# Patient Record
Sex: Female | Born: 1985 | Race: Black or African American | Hispanic: No | Marital: Single | Smoking: Never smoker
Health system: Southern US, Community
[De-identification: ages and names within clinical notes are randomized; demographics above are authoritative.]

## PROBLEM LIST (undated history)

## (undated) ENCOUNTER — Inpatient Hospital Stay (HOSPITAL_COMMUNITY): Payer: Self-pay

## (undated) DIAGNOSIS — A5400 Gonococcal infection of lower genitourinary tract, unspecified: Secondary | ICD-10-CM

## (undated) DIAGNOSIS — N200 Calculus of kidney: Secondary | ICD-10-CM

## (undated) DIAGNOSIS — Z8619 Personal history of other infectious and parasitic diseases: Secondary | ICD-10-CM

## (undated) DIAGNOSIS — R5381 Other malaise: Secondary | ICD-10-CM

## (undated) DIAGNOSIS — F329 Major depressive disorder, single episode, unspecified: Secondary | ICD-10-CM

## (undated) DIAGNOSIS — H1130 Conjunctival hemorrhage, unspecified eye: Secondary | ICD-10-CM

## (undated) DIAGNOSIS — F32A Depression, unspecified: Secondary | ICD-10-CM

## (undated) DIAGNOSIS — E559 Vitamin D deficiency, unspecified: Secondary | ICD-10-CM

## (undated) DIAGNOSIS — D649 Anemia, unspecified: Secondary | ICD-10-CM

## (undated) DIAGNOSIS — Z5189 Encounter for other specified aftercare: Secondary | ICD-10-CM

## (undated) HISTORY — PX: TUBAL LIGATION: SHX77

## (undated) HISTORY — DX: Personal history of other infectious and parasitic diseases: Z86.19

## (undated) HISTORY — DX: Major depressive disorder, single episode, unspecified: F32.9

## (undated) HISTORY — DX: Depression, unspecified: F32.A

## (undated) HISTORY — PX: HERNIA REPAIR: SHX51

## (undated) HISTORY — DX: Anemia, unspecified: D64.9

## (undated) HISTORY — DX: Encounter for other specified aftercare: Z51.89

---
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
## 2006-06-28 ENCOUNTER — Ambulatory Visit: Payer: Self-pay | Admitting: Family Medicine

## 2006-06-30 ENCOUNTER — Encounter (INDEPENDENT_AMBULATORY_CARE_PROVIDER_SITE_OTHER): Payer: Self-pay | Admitting: Family Medicine

## 2006-07-15 ENCOUNTER — Ambulatory Visit (HOSPITAL_COMMUNITY): Admission: RE | Admit: 2006-07-15 | Discharge: 2006-07-15 | Payer: Self-pay | Admitting: Obstetrics

## 2006-08-08 ENCOUNTER — Emergency Department (HOSPITAL_COMMUNITY): Admission: EM | Admit: 2006-08-08 | Discharge: 2006-08-08 | Payer: Self-pay | Admitting: Emergency Medicine

## 2006-09-02 ENCOUNTER — Inpatient Hospital Stay (HOSPITAL_COMMUNITY): Admission: AD | Admit: 2006-09-02 | Discharge: 2006-09-02 | Payer: Self-pay | Admitting: Obstetrics & Gynecology

## 2007-02-20 ENCOUNTER — Ambulatory Visit: Payer: Self-pay | Admitting: Family Medicine

## 2007-02-22 ENCOUNTER — Ambulatory Visit: Payer: Self-pay | Admitting: Family Medicine

## 2007-03-22 ENCOUNTER — Telehealth (INDEPENDENT_AMBULATORY_CARE_PROVIDER_SITE_OTHER): Payer: Self-pay | Admitting: *Deleted

## 2007-04-05 ENCOUNTER — Ambulatory Visit: Payer: Self-pay | Admitting: Nurse Practitioner

## 2007-04-05 LAB — CONVERTED CEMR LAB: Preg, Serum: NEGATIVE

## 2007-04-07 ENCOUNTER — Ambulatory Visit: Payer: Self-pay | Admitting: Nurse Practitioner

## 2007-06-29 ENCOUNTER — Encounter (INDEPENDENT_AMBULATORY_CARE_PROVIDER_SITE_OTHER): Payer: Self-pay | Admitting: Nurse Practitioner

## 2007-06-29 ENCOUNTER — Ambulatory Visit: Payer: Self-pay | Admitting: Family Medicine

## 2007-07-11 ENCOUNTER — Encounter (INDEPENDENT_AMBULATORY_CARE_PROVIDER_SITE_OTHER): Payer: Self-pay | Admitting: Family Medicine

## 2007-07-17 ENCOUNTER — Encounter: Payer: Self-pay | Admitting: Family Medicine

## 2007-09-20 ENCOUNTER — Ambulatory Visit: Payer: Self-pay | Admitting: Internal Medicine

## 2007-09-20 LAB — CONVERTED CEMR LAB
Bilirubin Urine: NEGATIVE
Urobilinogen, UA: 0.2

## 2007-10-04 ENCOUNTER — Telehealth (INDEPENDENT_AMBULATORY_CARE_PROVIDER_SITE_OTHER): Payer: Self-pay | Admitting: *Deleted

## 2007-10-10 ENCOUNTER — Emergency Department (HOSPITAL_COMMUNITY): Admission: EM | Admit: 2007-10-10 | Discharge: 2007-10-10 | Payer: Self-pay | Admitting: Emergency Medicine

## 2007-11-13 ENCOUNTER — Ambulatory Visit: Payer: Self-pay | Admitting: Family Medicine

## 2007-11-13 ENCOUNTER — Encounter (INDEPENDENT_AMBULATORY_CARE_PROVIDER_SITE_OTHER): Payer: Self-pay | Admitting: Family Medicine

## 2007-11-15 ENCOUNTER — Ambulatory Visit: Payer: Self-pay | Admitting: Family Medicine

## 2007-11-15 DIAGNOSIS — A5400 Gonococcal infection of lower genitourinary tract, unspecified: Secondary | ICD-10-CM

## 2007-11-15 HISTORY — DX: Gonococcal infection of lower genitourinary tract, unspecified: A54.00

## 2007-11-15 LAB — CONVERTED CEMR LAB: Chlamydia, DNA Probe: NEGATIVE

## 2007-12-12 ENCOUNTER — Ambulatory Visit: Payer: Self-pay | Admitting: Family Medicine

## 2008-01-26 ENCOUNTER — Telehealth (INDEPENDENT_AMBULATORY_CARE_PROVIDER_SITE_OTHER): Payer: Self-pay | Admitting: *Deleted

## 2008-01-29 ENCOUNTER — Ambulatory Visit: Payer: Self-pay | Admitting: Family Medicine

## 2008-01-31 ENCOUNTER — Ambulatory Visit: Payer: Self-pay | Admitting: Family Medicine

## 2008-02-13 ENCOUNTER — Ambulatory Visit: Payer: Self-pay | Admitting: Family Medicine

## 2008-02-15 ENCOUNTER — Encounter (INDEPENDENT_AMBULATORY_CARE_PROVIDER_SITE_OTHER): Payer: Self-pay | Admitting: *Deleted

## 2008-02-15 LAB — CONVERTED CEMR LAB: Chlamydia, DNA Probe: NEGATIVE

## 2008-02-16 ENCOUNTER — Telehealth (INDEPENDENT_AMBULATORY_CARE_PROVIDER_SITE_OTHER): Payer: Self-pay | Admitting: *Deleted

## 2008-03-04 ENCOUNTER — Ambulatory Visit: Payer: Self-pay | Admitting: Family Medicine

## 2008-04-04 ENCOUNTER — Telehealth (INDEPENDENT_AMBULATORY_CARE_PROVIDER_SITE_OTHER): Payer: Self-pay | Admitting: Family Medicine

## 2008-04-11 ENCOUNTER — Ambulatory Visit: Payer: Self-pay | Admitting: Nurse Practitioner

## 2008-04-11 DIAGNOSIS — H1130 Conjunctival hemorrhage, unspecified eye: Secondary | ICD-10-CM

## 2008-04-11 HISTORY — DX: Conjunctival hemorrhage, unspecified eye: H11.30

## 2008-05-27 ENCOUNTER — Ambulatory Visit: Payer: Self-pay | Admitting: Family Medicine

## 2008-08-28 ENCOUNTER — Encounter (INDEPENDENT_AMBULATORY_CARE_PROVIDER_SITE_OTHER): Payer: Self-pay | Admitting: Family Medicine

## 2008-08-28 ENCOUNTER — Ambulatory Visit: Payer: Self-pay | Admitting: Internal Medicine

## 2008-10-04 ENCOUNTER — Telehealth (INDEPENDENT_AMBULATORY_CARE_PROVIDER_SITE_OTHER): Payer: Self-pay | Admitting: Family Medicine

## 2008-10-11 ENCOUNTER — Encounter (INDEPENDENT_AMBULATORY_CARE_PROVIDER_SITE_OTHER): Payer: Self-pay | Admitting: *Deleted

## 2009-01-01 ENCOUNTER — Emergency Department (HOSPITAL_COMMUNITY): Admission: EM | Admit: 2009-01-01 | Discharge: 2009-01-01 | Payer: Self-pay | Admitting: Emergency Medicine

## 2009-01-10 ENCOUNTER — Telehealth (INDEPENDENT_AMBULATORY_CARE_PROVIDER_SITE_OTHER): Payer: Self-pay | Admitting: Family Medicine

## 2009-01-27 ENCOUNTER — Other Ambulatory Visit: Admission: RE | Admit: 2009-01-27 | Discharge: 2009-01-27 | Payer: Self-pay | Admitting: Internal Medicine

## 2009-01-27 ENCOUNTER — Encounter (INDEPENDENT_AMBULATORY_CARE_PROVIDER_SITE_OTHER): Payer: Self-pay | Admitting: Nurse Practitioner

## 2009-01-27 ENCOUNTER — Ambulatory Visit: Payer: Self-pay | Admitting: Nurse Practitioner

## 2009-01-27 LAB — CONVERTED CEMR LAB
ALT: 20 units/L (ref 0–35)
Albumin: 4.2 g/dL (ref 3.5–5.2)
BUN: 10 mg/dL (ref 6–23)
CO2: 23 meq/L (ref 19–32)
Calcium: 9.8 mg/dL (ref 8.4–10.5)
Chloride: 109 meq/L (ref 96–112)
Creatinine, Ser: 0.75 mg/dL (ref 0.40–1.20)
Eosinophils Absolute: 0.1 10*3/uL (ref 0.0–0.7)
Eosinophils Relative: 4 % (ref 0–5)
Glucose, Urine, Semiquant: NEGATIVE
HCT: 36.4 % (ref 36.0–46.0)
Hemoglobin: 11.9 g/dL — ABNORMAL LOW (ref 12.0–15.0)
Lymphs Abs: 1.8 10*3/uL (ref 0.7–4.0)
MCV: 97.1 fL (ref 78.0–100.0)
Monocytes Relative: 5 % (ref 3–12)
Nitrite: NEGATIVE
Potassium: 5.6 meq/L — ABNORMAL HIGH (ref 3.5–5.3)
Protein, U semiquant: 30
RBC: 3.75 M/uL — ABNORMAL LOW (ref 3.87–5.11)
TSH: 0.932 microintl units/mL (ref 0.350–4.500)
Urobilinogen, UA: 0.2
WBC Urine, dipstick: NEGATIVE
WBC: 4.1 10*3/uL (ref 4.0–10.5)
pH: 6.5

## 2009-01-30 ENCOUNTER — Ambulatory Visit: Payer: Self-pay | Admitting: Nurse Practitioner

## 2009-07-30 ENCOUNTER — Ambulatory Visit: Payer: Self-pay | Admitting: Nurse Practitioner

## 2009-07-30 DIAGNOSIS — R5383 Other fatigue: Secondary | ICD-10-CM

## 2009-07-30 DIAGNOSIS — N898 Other specified noninflammatory disorders of vagina: Secondary | ICD-10-CM

## 2009-07-30 DIAGNOSIS — R5381 Other malaise: Secondary | ICD-10-CM

## 2009-07-30 HISTORY — DX: Other malaise: R53.81

## 2009-07-30 LAB — CONVERTED CEMR LAB
Basophils Relative: 0 % (ref 0–1)
Eosinophils Absolute: 0.1 10*3/uL (ref 0.0–0.7)
MCHC: 32.6 g/dL (ref 30.0–36.0)
Monocytes Relative: 6 % (ref 3–12)
Neutro Abs: 2.1 10*3/uL (ref 1.7–7.7)
Neutrophils Relative %: 43 % (ref 43–77)
Platelets: 326 10*3/uL (ref 150–400)
RBC: 3.94 M/uL (ref 3.87–5.11)
WBC: 4.8 10*3/uL (ref 4.0–10.5)
hCG, Beta Chain, Quant, S: 439.2 milliintl units/mL

## 2009-08-07 ENCOUNTER — Ambulatory Visit: Payer: Self-pay | Admitting: Nurse Practitioner

## 2009-08-18 ENCOUNTER — Telehealth (INDEPENDENT_AMBULATORY_CARE_PROVIDER_SITE_OTHER): Payer: Self-pay | Admitting: Nurse Practitioner

## 2010-01-01 ENCOUNTER — Ambulatory Visit: Payer: Self-pay | Admitting: Nurse Practitioner

## 2010-01-01 DIAGNOSIS — B379 Candidiasis, unspecified: Secondary | ICD-10-CM | POA: Insufficient documentation

## 2010-01-01 LAB — CONVERTED CEMR LAB
Ketones, urine, test strip: NEGATIVE
Nitrite: NEGATIVE
Specific Gravity, Urine: >=1.03
Urobilinogen, UA: 0.2
WBC Urine, dipstick: NEGATIVE

## 2010-01-14 ENCOUNTER — Emergency Department (HOSPITAL_COMMUNITY): Admission: EM | Admit: 2010-01-14 | Discharge: 2010-01-14 | Payer: Self-pay | Admitting: Family Medicine

## 2010-05-11 ENCOUNTER — Telehealth (INDEPENDENT_AMBULATORY_CARE_PROVIDER_SITE_OTHER): Payer: Self-pay | Admitting: Nurse Practitioner

## 2010-06-24 ENCOUNTER — Telehealth (INDEPENDENT_AMBULATORY_CARE_PROVIDER_SITE_OTHER): Payer: Self-pay | Admitting: Nurse Practitioner

## 2010-07-02 ENCOUNTER — Ambulatory Visit
Admission: RE | Admit: 2010-07-02 | Discharge: 2010-07-02 | Payer: Self-pay | Source: Home / Self Care | Attending: Nurse Practitioner | Admitting: Nurse Practitioner

## 2010-07-02 DIAGNOSIS — R109 Unspecified abdominal pain: Secondary | ICD-10-CM | POA: Insufficient documentation

## 2010-07-02 LAB — CONVERTED CEMR LAB
Bilirubin Urine: NEGATIVE
Ketones, urine, test strip: NEGATIVE
Protein, U semiquant: 30
Urobilinogen, UA: 0.2

## 2010-07-07 NOTE — Progress Notes (Signed)
Summary: GYN Referral  Phone Note From Other Clinic   Caller: Referral Coordinator Summary of Call: Printed off referral today & call to sched Pt appt.Marland KitchenMarland KitchenPt aware of appt on 08-21-09 @ 2pm @ [facility].Let me know if I could be of further assistance Initial call taken by: Candi Leash,  August 18, 2009 4:06 PM  Follow-up for Phone Call        noted Follow-up by: Lehman Prom FNP,  August 18, 2009 4:07 PM

## 2010-07-07 NOTE — Assessment & Plan Note (Signed)
Summary: Vaginal bleeding   Vital Signs:  Patient profile:   25 year old female Weight:      106.5 pounds BMI:     19.55 BSA:     1.46 Temp:     97.9 degrees F oral Pulse rate:   121 / minute Pulse rhythm:   regular Resp:     20 per minute BP sitting:   141 / 95  (left arm) Cuff size:   regular  Vitals Entered By: Levon Hedger (July 30, 2009 12:14 PM) CC: pt state she had an abortion 2 weeks ago and is still bleeding and is not able to cope with it....pt is very distressed Is Patient Diabetic? No Pain Assessment Patient in pain? no       Does patient need assistance? Functional Status Self care Ambulation Normal Comments pt is not taking ortho try cyclen   Primary Care Provider:  [facility]  CC:  pt state she had an abortion 2 weeks ago and is still bleeding and is not able to cope with it....pt is very distressed.  History of Present Illness:  Pt into the office for follow up for vaginal bleeding. Abortion done on February 11th, 2011 in [place]. Pt was given a 2 week follow up but reports she is not able to keep the appointment. Pt reports that she has trouble coping following the procedure. She already has 2 children and they went to stay with their father in the interim.  They have since returned home and she is getting back into her routine. She quit her job but has since found a new one. She is still having trouble sleeping, cries to sleep at night - ok during the day when she is otherwise occupied.  Bleeding on the day of the procedure and into the night, with cramping.  Stopped the next day but then stopped the following day and then started back 3 days after the procedure.  Still at this time with scant flow, no cramping but pt is just concerned about the length of flow.  Allergies (verified): 1)  ! Amoxicillin 2)  ! Ibuprofen  Review of Systems General:  Complains of sleep disorder. CV:  Denies chest pain or discomfort. Resp:  Denies  cough. GI:  Denies abdominal pain. GU:  Complains of abnormal vaginal bleeding.  Physical Exam  General:  alert.   Head:  normocephalic.   Abdomen:  normal bowel sounds.   Msk:  normal ROM.   Neurologic:  alert & oriented X3.     Impression & Recommendations:  Problem # 1:  VAGINAL BLEEDING (ICD-623.8) advised pt the usual course Orders: T-Pregnancy (Serum), Quant. [id number]) T-CBC w/Diff ([phone])  Problem # 2:  FATIGUE (ICD-780.79) will check cbc. advised pt to keep a diary of events Orders: T-CBC w/Diff ([id number])  Patient Instructions: 1)  Take tylenol pm over the counter at night for sleep. 2)  This is just temporary.  Get in a daily routine. 3)  Keep a notebook of your daily feelings and emtions.  Do this at night after you take your tylenol pm and record your thoughts. 4)  Follow up next week. Bring your journal with you

## 2010-07-07 NOTE — Assessment & Plan Note (Signed)
Summary: Vaginal Bleeding/Family Planning   Vital Signs:  Patient profile:   25 year old female Weight:      104.3 pounds Temp:     98.2 degrees F oral Pulse rate:   78 / minute Pulse rhythm:   regular Resp:     20 per minute BP sitting:   112 / 80  (left arm) Cuff size:   regular  Vitals Entered By: Levon Hedger (August 07, 2009 12:27 PM) CC: follow-up visit Is Patient Diabetic? No Pain Assessment Patient in pain? no       Does patient need assistance? Functional Status Self care Ambulation Normal   CC:  follow-up visit.  History of Present Illness:  Pt into the office for follow up  - 1 week visit for vaginal bleeding. Pt has been doing well over the past week. Much better spirits today. Bleeding has tapered to just spotting - labs ok on last week.  no anemia Beta Hcg trending as expected  Depressed mood on last week with regrets on her decision.  Pt was encouraged to keep a journal nightly and express her thoughts.  Although she did not bring the journal into the office she did write in it nightly.  She took tylenol PM to help her rest.  She found it helpful to write in the journal and had only 1 day that she did not work but stayed in the bed all day.  Family support is available to help with the children. She has started her new job and has a 20 minute commute from work daily.  Family planning - no current birthcontrol pt was previously on depoprovera but quit taking it because she thought it made her lose weight. She was last on oral contraceptives but she could not remember to take them daily hence her recent pregnancy. 3 children current stable relationship   Habits & Providers  Alcohol-Tobacco-Diet     Alcohol drinks/day: 0     Tobacco Status: never  Exercise-Depression-Behavior     Does Patient Exercise: no     Depression Counseling: not indicated; screening negative for depression     Drug Use: no     Seat Belt Use: 100     Sun Exposure:  occasionally  Allergies (verified): 1)  ! Amoxicillin 2)  ! Ibuprofen  Review of Systems General:  Denies fever. CV:  Denies chest pain or discomfort. Resp:  Denies cough. GI:  Denies abdominal pain. GU:  Complains of abnormal vaginal bleeding; vaginal bleeding has decreased to spotting.  Physical Exam  General:  alert.   Msk:  normal ROM.   Neurologic:  alert & oriented X3.   Skin:  color normal.   Psych:  Oriented X3 and good eye contact.     Impression & Recommendations:  Problem # 1:  VAGINAL BLEEDING (ICD-623.8) resolving  Problem # 2:  FAMILY PLANNING (ICD-V25.09)  pt has decided that she would like the IUD will refer to GYN for placement  Orders: Gynecologic Referral (Gyn)  Patient Instructions: 1)  Your labs were ok as done on the last visit. 2)  You should continue to do the journaling for the next 3 weeks and then start to make a check list of things you want to improve.

## 2010-07-07 NOTE — Assessment & Plan Note (Signed)
Summary: Acute - Candidiasis   Vital Signs:  Patient profile:   25 year old female Menstrual status:  mirena Weight:      102.7 pounds Temp:     98.3 degrees F oral Pulse rate:   78 / minute Pulse rhythm:   regular Resp:     20 per minute BP sitting:   111 / 74  (left arm) Cuff size:   regular  Vitals Entered By: Levon Hedger (January 01, 2010 3:45 PM) CC: vaginal discomfort has been swimming alot and felt a little irritation in that area and wants to be tested for yeast infection Is Patient Diabetic? No Pain Assessment Patient in pain? no       Does patient need assistance? Functional Status Self care Ambulation Normal LMP - Character: light     Menstrual Status mirena Last PAP Result  Specimen Adequacy: Satisfactory for evaluation.   Interpretation/Result:Negative for intraepithelial Lesion or Malignancy.      Primary Care Provider:  [facility]  CC:  vaginal discomfort has been swimming alot and felt a little irritation in that area and wants to be tested for yeast infection.  History of Present Illness:  Pt into the office with c/o possible yeast infection Pt reports that she has been going to the pool a lot She did purchase some OTC medications 1 weeks ago which did provide some relief. +itching +discharge (thick, white) no odor No dysuria or hematuria  Family Planning - IUD inserted since last visit here. Scant spotting and cramping first but now she is tolerating well.  Habits & Providers  Alcohol-Tobacco-Diet     Alcohol drinks/day: 0     Tobacco Status: never  Exercise-Depression-Behavior     Does Patient Exercise: no     Depression Counseling: not indicated; screening negative for depression     Drug Use: no     Seat Belt Use: 100     Sun Exposure: occasionally  Allergies (verified): 1)  ! Amoxicillin 2)  ! Ibuprofen  Review of Systems General:  Denies fever. CV:  Denies chest pain or discomfort. Resp:  Denies cough. GI:  Denies abdominal  pain, nausea, and vomiting. GU:  Complains of discharge; denies abnormal vaginal bleeding, dysuria, hematuria, and urinary frequency.  Physical Exam  General:  alert.  thin Head:  normocephalic.   Lungs:  normal breath sounds.   Heart:  normal rate and regular rhythm.   Abdomen:  normal bowel sounds.   Msk:  normal ROM.   Neurologic:  alert & oriented X3.   Skin:  color normal.   Psych:  Oriented X3.     Impression & Recommendations:  Problem # 1:  CANDIDIASIS (ICD-112.9) pt has take OTC meds and symptoms have resolved will order diflucan 150mg  by mouth x 1 dose if symptoms return.  Problem # 2:  INTRAUTERINE CONTRACEPTIVE DEVICE SURVEILLANCE (ICD-V25.42) pt is tolerating well  Complete Medication List: 1)  Fluconazole 150 Mg Tabs (Fluconazole) .... One tablet by mouth x 1 dose  Patient Instructions: 1)  Urine sample ok. 2)  Still see a few yeast buds under the microscope. 3)  If symptoms return may start diflucan 150mg  by mouth x 1 dose. 4)  Follow up as needed Prescriptions: FLUCONAZOLE 150 MG TABS (FLUCONAZOLE) One tablet by mouth x 1 dose  #1 x 0   Entered and Authorized by:   Lehman Prom FNP   Signed by:   Lehman Prom FNP on 01/01/2010   Method used:   Print then  Give to Patient   RxID:   [id number]   Laboratory Results   Urine Tests  Date/Time Received: January 01, 2010 3:59 PM   Routine Urinalysis   Color: lt. yellow Appearance: Clear Glucose: negative   (Normal Range: Negative) Bilirubin: negative   (Normal Range: Negative) Ketone: negative   (Normal Range: Negative) Spec. Gravity: >=1.030   (Normal Range: 1.003-1.035) Blood: negative   (Normal Range: Negative) pH: 5.0   (Normal Range: 5.0-8.0) Protein: trace   (Normal Range: Negative) Urobilinogen: 0.2   (Normal Range: 0-1) Nitrite: negative   (Normal Range: Negative) Leukocyte Esterace: negative   (Normal Range: Negative)    Date/Time Received: January 01, 2010 4:14 PM   Wet  Mount/KOH Source: vaginal WBC/hpf: 1-5 Bacteria/hpf: rare Clue cells/hpf: few Yeast/hpf: few Trichomonas/hpf: none    Laboratory Results   Urine Tests    Routine Urinalysis   Color: lt. yellow Appearance: Clear Glucose: negative   (Normal Range: Negative) Bilirubin: negative   (Normal Range: Negative) Ketone: negative   (Normal Range: Negative) Spec. Gravity: >=1.030   (Normal Range: 1.003-1.035) Blood: negative   (Normal Range: Negative) pH: 5.0   (Normal Range: 5.0-8.0) Protein: trace   (Normal Range: Negative) Urobilinogen: 0.2   (Normal Range: 0-1) Nitrite: negative   (Normal Range: Negative) Leukocyte Esterace: negative   (Normal Range: Negative)      Wet Mount Wet Mount KOH: Negative

## 2010-07-07 NOTE — Progress Notes (Signed)
Summary: PELVIC PAIN  Phone Note Call from Patient Call back at Home Phone [phone]   Reason for Call: Talk to Nurse Summary of Call: MARTIN PT. MS Blyden SAYS SHE HAS BEEN HAVING PAIN IN HER PELVIC AREA FOR A WEEK AND DISCHARGE. SHE HAD THE BIRANA TAKEN OUT 2 MONTHS AGO AND SHE THINKS IT MAY HAVE SOMETHING TO DO WITH THAT. Initial call taken by: Leodis Rains,  May 11, 2010 9:11 AM  Follow-up for Phone Call        left message to return call....Marland KitchenMarland KitchenHassell Halim CMA  May 11, 2010 12:22 PM   Additional Follow-up for Phone Call Additional follow up Details #1::        left message to return call.Hassell Halim CMA  May 12, 2010 2:41 PM     Additional Follow-up for Phone Call Additional follow up Details #2::    patient returned your call to let you know that her gyn office [facility] saw her yesterday and everything is ok, and she doesn't need an appt. now. Follow-up by: Leodis Rains,  May 12, 2010 4:58 PM

## 2010-07-09 NOTE — Assessment & Plan Note (Signed)
Summary: Abdominal cramping   Vital Signs:  Patient profile:   25 year old female Menstrual status:  mirena Weight:      106.0 pounds BMI:     19.46 Temp:     99.0 degrees F oral Pulse rate:   80 / minute Pulse rhythm:   regular Resp:     20 per minute BP sitting:   102 / 76  (left arm) Cuff size:   regular  Vitals Entered By: Levon Hedger (July 02, 2010 12:36 PM) CC: has not had a cycle in 4 months and has been having stomach cramps x 3 weeks..vaginal discharge Is Patient Diabetic? No Pain Assessment Patient in pain? no       Does patient need assistance? Functional Status Self care Ambulation Normal Comments pt states she takes iron multivitamin daily.   Primary Care Provider:  [facility]  CC:  has not had a cycle in 4 months and has been having stomach cramps x 3 weeks..vaginal discharge.  History of Present Illness:  Pt into the office with c/o abdominal pain. Mirena placed about 1 year ago but pt did not like it because she could "feel" it during straining activity at work.  Removed in 02/2010 Depoprovera was started in the meantime until she should get the implanon ordered. implanon placed in left arm 05/2010 No menses with any of the above methods  Lower abdominal cramping started 2 weeks ago. Slight discharge No odor  No blood in urine. Admits to intercourse 2 weeks ago.  Condom was left inside for 4 days before she realized it.   Allergies (verified): 1)  ! Amoxicillin 2)  ! Ibuprofen  Review of Systems General:  Denies fever. CV:  Denies fatigue. Resp:  Denies cough. GI:  Denies abdominal pain, nausea, and vomiting.  Physical Exam  General:  alert.   Head:  normocephalic.   Lungs:  normal breath sounds.   Heart:  normal rate and regular rhythm.   Msk:  up to the exam table Neurologic:  alert & oriented X3.   Skin:  color normal.   Psych:  Oriented X3.     Impression & Recommendations:  Problem # 1:  ABDOMINAL CRAMPS  (ICD-789.00)  advised pt that cramping is due to change in hormones with changes in contraception  Orders: KOH/ WET Mount [phone]) UA Dipstick w/o Micro (manual) (30865) Urine Pregnancy Test  (78469)  Problem # 2:  INSERTION OF IMPLANTABLE SUBDERMAL CONTRACEPTIVE (ICD-V25.5)  Complete Medication List: 1)  Metrogel-vaginal 0.75 % Gel (Metronidazole) .... One application intravaginally at night  Patient Instructions: 1)  Abdominal cramping is most likely from the change in hormones your body has gone through over the past 3 months.  You can take aleve or advil for cramping. 2)  Pregnancy test is negative. Checked just to reassure you. 3)  Cramping/discharge will likely resolve on its own.  Again this is most likely due to hormones. But will order metrogel to use just in case symptoms get worse Prescriptions: METROGEL-VAGINAL 0.75 % GEL (METRONIDAZOLE) One application intravaginally at night  #45gm x 0   Entered and Authorized by:   Lehman Prom FNP   Signed by:   Lehman Prom FNP on 07/02/2010   Method used:   Print then Give to Patient   RxID:   [id number]    Orders Added: 1)  Est. Patient Level III [72536] 2)  KOH/ WET Mount [64403] 3)  UA Dipstick w/o Micro (manual) [81002] 4)  Urine Pregnancy Test  [  81025]    Prevention & Chronic Care Immunizations   Influenza vaccine: Not documented   Influenza vaccine deferral: Refused  (07/02/2010)    Tetanus booster: 01/27/2009: Tdap    Pneumococcal vaccine: Not documented  Other Screening   Pap smear:  Specimen Adequacy: Satisfactory for evaluation.   Interpretation/Result:Negative for intraepithelial Lesion or Malignancy.     (01/27/2009)   Smoking status: never  (01/01/2010)   Laboratory Results   Urine Tests  Date/Time Received: July 02, 2010 1:30 PM   Routine Urinalysis   Color: lt. yellow Glucose: negative   (Normal Range: Negative) Bilirubin: negative   (Normal Range: Negative) Ketone:  negative   (Normal Range: Negative) Spec. Gravity: >=1.030   (Normal Range: 1.003-1.035) Blood: negative   (Normal Range: Negative) pH: 6.0   (Normal Range: 5.0-8.0) Protein: 30   (Normal Range: Negative) Urobilinogen: 0.2   (Normal Range: 0-1) Nitrite: negative   (Normal Range: Negative) Leukocyte Esterace: negative   (Normal Range: Negative)    Urine HCG: positive Date/Time Received: July 02, 2010 1:27 PM   [employer] Source: vaginal WBC/hpf: 1-5 Bacteria/hpf: 1+ Clue cells/hpf: few Yeast/hpf: none Wet Mount KOH: Negative Trichomonas/hpf: none

## 2010-07-09 NOTE — Progress Notes (Addendum)
Summary: abdominal cramping  Phone Note Call from Patient   Complaint: Urinary/GYN Problems Summary of Call: pt is having cramps in stomach and has not had a cycle in for months//not sure if it is her birth control [phone] can't get appt till march to long to wait Initial call taken by: Arta Bruce,  June 24, 2010 8:18 AM  Follow-up for Phone Call        Left message on voicemail for pt. to return call.  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 Additional Follow-up by: Leodis Rains,  June 25, 2010 8:54 AM    Additional Follow-up for Phone Call Additional follow up Details #2::    what birth control is she talking about?  Pills - last rx 07/2009 pt can keep appt with provider in March if she wants to restart pills then she can come from a nurse visit for pregnancy test if negative provider can start pills.  she still needs to schedule another visit for a cpe  n.martin,fnp June 25, 2010  1:37 PM  No appointment noted in March.  Left message on voicemail for pt. to return call.  Dutch Quint RN  June 26, 2010 12:27 PM    Appended Document: abdominal cramping Has been using implannon, ordered by a different provider, has been using for a month -- had a condom left inside of her two weeks ago, came out 4 days later.  Has been having intermittent lower abdominal cramping ever since. Cramping is getting worse.  Denies fever, has white, thin discharge, denies odor.  Denies urinary or any other symptoms.    Has only taken Tylenol for discomfort.  Wants to be seen, appt. 07/02/10.  Dutch Quint RN  June 29, 2010 9:30 AM

## 2010-08-21 LAB — POCT URINALYSIS DIPSTICK
Bilirubin Urine: NEGATIVE
Glucose, UA: NEGATIVE mg/dL
Nitrite: NEGATIVE
Urobilinogen, UA: 1 mg/dL (ref 0.0–1.0)
pH: 6 (ref 5.0–8.0)

## 2010-08-21 LAB — WET PREP, GENITAL
Trich, Wet Prep: NONE SEEN
Yeast Wet Prep HPF POC: NONE SEEN

## 2010-08-21 LAB — POCT PREGNANCY, URINE: Preg Test, Ur: NEGATIVE

## 2010-08-21 LAB — URINE CULTURE: Culture  Setup Time: 201108102217

## 2010-08-21 LAB — GC/CHLAMYDIA PROBE AMP, GENITAL: Chlamydia, DNA Probe: NEGATIVE

## 2010-09-13 LAB — POCT URINALYSIS DIP (DEVICE)
Hgb urine dipstick: NEGATIVE
Ketones, ur: NEGATIVE mg/dL
Protein, ur: NEGATIVE mg/dL
Specific Gravity, Urine: >=1.03 (ref 1.005–1.030)
Urobilinogen, UA: 1 mg/dL (ref 0.0–1.0)
pH: 5.5 (ref 5.0–8.0)

## 2010-09-13 LAB — GC/CHLAMYDIA PROBE AMP, GENITAL
Chlamydia, DNA Probe: NEGATIVE
GC Probe Amp, Genital: NEGATIVE

## 2010-09-13 LAB — WET PREP, GENITAL: WBC, Wet Prep HPF POC: NONE SEEN

## 2010-09-13 LAB — POCT PREGNANCY, URINE: Preg Test, Ur: NEGATIVE

## 2010-10-23 NOTE — H&P (Signed)
NAMEIMUNIQUE, SAMAD                      ACCOUNT NO.:  [id number]   MEDICAL RECORD NO.:  [id number]                   PATIENT TYPE:  INP   LOCATION:  9165                                 FACILITY:  [facility]   PHYSICIAN:  Naima A. Dillard, M.D.              DATE OF BIRTH:  1986/05/11   DATE OF ADMISSION:  07/18/2002  DATE OF DISCHARGE:                                HISTORY & PHYSICAL   HISTORY OF PRESENT ILLNESS:  Ms. Mitschke is a 25 year old single black  female, primigravida at 61 and 5/7ths weeks who presents with regular  uterine contractions all day that have become more intense.  Her cervical  exam at the office earlier today was 1 cm.  She denies leaking, bleeding,  headache, nausea, vomiting, or fetal disturbances.  Her pregnancy has been  followed by [facility] certified nurse midwife service, and has  been remarkable for 1) decreased social support, 2) adolescent, 3)  questionable last menstrual period, 4) small frame, 5) group B strep  negative.   PRENATAL LABORATORIES:  Her prenatal labs were collected on 12/26/01.  Hemoglobin was 11.5, platelets 278,000, blood type O+, antibody negative,  sickle cell trait negative, RPR nonreactive, rubella immune, hepatitis B  surface antigen negative, HIV nonreactive.  Pap smear from 3/03 was normal.  Gonorrhea negative, Chlamydia negative.  On 01/23/02, her paternal serum  alpha fetoprotein was within normal range.  On 04/17/02, her one hour  Glucola was 80, and her hemoglobin at that time was 11.3. Culture of the  vaginal tract for group B strep, gonorrhea, and Chlamydia on 06/18/02 were  all negative.   HISTORY OF PRESENT PREGNANCY:  The patient presented for care on 12/26/01 at  approximately 10-1/[redacted] weeks gestation.  First trimester pregnancy  ultrasonography gave an EDC of 07/21/02.  From that initial ultrasound, there  was a possible cord insertion anomaly, as well as gastroschisis versus  _______.  Level II  ultrasonography at [facility] showed no  abnormalities noted.  At 16-1/[redacted] weeks gestation, at her routine visit the  patient was having difficulty with the father of the baby.  She was upset  and crying at that time, and stated that she was [facility]  and would get counseling through that association.  Pregnancy  ultrasonography at about [redacted] weeks gestation confirmed EDC.  At [redacted] weeks  gestation, she was noted to have uterine contractions and was treated in  maternity admissions with terbutaline, and had a negative fetal fibronectin  at that time.  Pregnancy ultrasonography at [redacted] weeks gestation was performed  secondary to size less than dates.  Estimated fetal weight at that time was  in the 75th to 90th percentile with normal fluid.  The rest of the prenatal  care was unremarkable.   OBSTETRICAL HISTORY:  She is a primigravida.   MEDICAL HISTORY:  She has no medication allergies.  She reports  having had  the usual childhood illnesses.  She has used oral contraceptives in the past  which she stopped in 4/03.  She was treated for Chlamydia in 1/03, and  treated for Trichomonas in 4/03.  She had a urinary tract infection x1.  In  the past, she fractured per pinky finger on her right hand.   SURGICAL HISTORY:  Remarkable for herniorrhaphy 10/18/01.   GENETIC HISTORY:  Unremarkable.   SOCIAL HISTORY:  Father of the baby is not involved with the pregnancy.  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  Father of the baby is high school  educated and in the [employer].  The patient denies any alcohol, tobacco,  or illicit drug use with the pregnancy.   OBJECTIVE DATA:  VITAL SIGNS:  Stable.  She is afebrile.  HEENT:  Grossly within normal limits.  CHEST:  Clear to auscultation.  HEART:  Regular rate and rhythm.  ABDOMEN:  Gravid and contoured with frontal head extending approximately 39  cm above the pubic symphysis.  Electronic fetal monitoring shows  reactive  reassuring fetal heart rate.  Uterine contractions every 3-5 minutes lasting  80 seconds of moderate intensity.  CERVICAL EXAM:  Per R.N., 3.5 cm, 80%, vertex, -2.  EXTREMITIES:  Within normal limits.   ASSESSMENT:  1. Intrauterine pregnancy at term.  2. Early labor.   PLAN:  1. Admit to birthing suites for a consult with Dr. Normand Sloop.  2. Routine CNM orders.  3. Plan IV pain medication.     Cam Hai, C.N.M.                     Naima A. Normand Sloop, M.D.    KS/MEDQ  D:  07/19/2002  T:  07/19/2002  Job:  [id number]

## 2010-10-23 NOTE — Discharge Summary (Signed)
   [name], [name]                      ACCOUNT NO.:  [id number]   MEDICAL RECORD NO.:  [id number]                   PATIENT TYPE:  INP   LOCATION:  9104                                 FACILITY:  [facility]   PHYSICIAN:  Crist Fat. Rivard, M.D.              DATE OF BIRTH:  [birth year]   DATE OF ADMISSION:  07/18/2002  DATE OF DISCHARGE:  07/21/2002                                 DISCHARGE SUMMARY   ADMISSION DIAGNOSES:  1. Intrauterine pregnancy at term.  2. Early labor.   DISCHARGE DIAGNOSES:  1. Intrauterine pregnancy at term.  2. Arrest of second stage of labor.  3. Meconium stained amniotic fluid.  4. Chorioamnionitis.   PROCEDURE:  1. Vacuum extraction, vaginal delivery.  2. Repair of bilateral labial lacerations.   HOSPITAL COURSE:  The patient is a 25 year old single black female  primigravida at 39-5/7 weeks who presented in early labor.  She progressed  to 5 cm and then her membranes were artificially ruptured.  She received an  epidural for pain.  Her progress was protracted and therefore received  internal monitoring as well as Pitocin augmentation.  She got to complete  with ROP position and pushed for 50 minutes, reaching +1 to +2 station.  Dr.  Stefano Gaul was consulted regarding the possibility of operative delivery.  The  patient was delivered via vacuum extraction.  The infant was a viable female,  weight 7 pounds 4 ounces, Apgars 9 at one minute and 9 at five minutes.  There was no meconium stain below the cord.  He was taken to the nursery in  good condition.  Mother recovered well after the procedure.  By postpartum  day #1, her hemoglobin was 9.3.  It had been 11.4 upon admission.  She was  afebrile.  By postpartum day #2, she continued to do well.  She expressed a  desire for Depo-Provera for contraception.  She was bottle feeding the  infant.  Social work was consulted regarding her housing and relational  issues.  The patient was deemed to have received  full benefit of her  hospital stay, and she was discharged home.   DISCHARGE MEDICATIONS:  1. Motrin 600 mg one p.o. q.6h. p.r.n. pain.  2. Tylox one to two p.o. q.3-4h. p.r.n. pain.  3. Depo-Provera 150 mg IM x1 prior to discharge.   DISCHARGE INSTRUCTIONS:  [facility] handout.  Discharge follow up  to occur at [facility] in four to six week postpartum.     Cam Hai, C.N.M.                     Crist Fat Rivard, M.D.    KS/MEDQ  D:  07/21/2002  T:  07/21/2002  Job:  [id number]

## 2010-10-23 NOTE — Op Note (Signed)
[name]  Patient:    [name], [name] Visit Number: [id number] MRN: [id number]          Service Type: DSU Location: [facility] Attending Physician:  Carlos Levering Dictated by:   Hyman Bible Pendse, M.D. Proc. Date: 10/18/01 Admit Date:  10/18/2001   CC:         Merita Norton, M.D.   Operative Report  PREOPERATIVE DIAGNOSIS:  Incarcerated  supraumbilical ventral hernia.  POSTOPERATIVE DIAGNOSIS:  Incarcerated  supraumbilical ventral hernia.  OPERATION PERFORMED:  Repair of incarcerated supraumbilical ventral hernia.  SURGEON:  Prabhakar D. Levie Heritage, M.D.  ASSISTANT:  Nurse.  ANESTHESIA:  Nurse.  DESCRIPTION OF PROCEDURE:  Under satisfactory general anesthesia, patient in supine position, the abdomen was thoroughly prepped and draped in the usual manner.  About 2.5 cm long transverse incision was made directly over the palpable incarcerated ventral hernia lesion.  Skin and subcutaneous tissue incised.  Bleeders were individually clamped, cut and electrocoagulated.  By blunt and sharp dissection incarcerated supraumbilical ventral hernia was identified.  There was preperitoneal fatty tissue with thin sac.  This was delineated up to the fascial defect, suture ligated with 2-0 silk and the fatty tissue was excised.  The remnant of the sac and tissue were returned to the peritoneal cavity.  The fascial defect was repaired in 3-0 Vicryl interrupted sutures. Satisfactory repair was accomplished.  0.25% Marcaine with epinephrine was injected locally for postoperative analgesia. Subcutaneous tissue apposed with 4-0 Vicryl, skin closed with 4-0 Monocryl subcuticular sutures.  Steri-Strips and pressure dressing applied.  Throughout the procedure, the patients vital signs remained stable.  The patient withstood the procedure well and was transferred to the recovery room in satisfactory general condition. Dictated by:   Hyman Bible Pendse, M.D. Attending Physician:  Carlos Levering DD:  10/18/01 TD:  10/18/01 Job: [id number]

## 2010-10-23 NOTE — Op Note (Signed)
[name], [name]                      ACCOUNT NO.:  [id number]   MEDICAL RECORD NO.:  [id number]                   PATIENT TYPE:  INP   LOCATION:  9104                                 FACILITY:  [facility]   PHYSICIAN:  [name], M.D.            DATE OF BIRTH:  Feb 28, 1986   DATE OF PROCEDURE:  07/19/2002  DATE OF DISCHARGE:                                 OPERATIVE REPORT   PREOPERATIVE DIAGNOSIS:  1. Term intrauterine pregnancy.  2. Arrest of second stage of labor.  3. Chorioamnionitis.  4. Meconium stained amniotic fluid.   POSTOPERATIVE DIAGNOSES:  1. Term intrauterine pregnancy.  2. Arrest of second stage of labor.  3. Chorioamnionitis.  4. Meconium stained amniotic fluid.   PROCEDURE:  1. Vacuum extraction vaginal delivery.  2. Repair of bilateral labial lacerations.   SURGEON:  [name], M.D.   FIRST ASSISTANT:  [name], C.N.M.   ANESTHESIA:  Epidural and local Xylocaine.   INDICATIONS:  The patient is a 25 year old female, gravida 1, para 0, who  presents at 76 weeks' gestation in active labor.  She had a long and  protracted labor.  She was able to completely dilate her cervix and she  pushed for greater than one hour.  She began to tire of the pushing process.  The patient was noted to have a temperature that was high as 100.5.  She was  given Tylenol and ampicillin.  She was noted to have meconium stained  amniotic fluid.  We discussed our options for management which included  observation only, continued pushing, operative vaginal delivery and cesarean  delivery.  The risks and benefits of each of those options were outlined.  The patient and her family elected to proceed with operative vaginal  delivery.  The advantages and disadvantages of forceps vaginal delivery and  vacuum extraction were then discussed.  The patient elected to proceed with  vacuum extraction vaginal delivery.  The specific risks associated with  vacuum extraction were reviewed including caput formation, hematoma  formation, the rare risks of intracranial bleeding, and the risks that the  vacuum extraction would be unsuccessful and that we would need to proceed  with cesarean delivery.  After carefully considering those options, the  patient elected to proceed with vacuum extraction vaginal delivery.  The  patient's mother, grandmother and uncle all agreed with that decision.   FINDINGS:  The weight of the infant is currently not known.  A female infant  was delivered from an occiput anterior presentation.  His name is [name].  The Apgars were 9 at one minute and 9 at five minutes.  There was no  meconium noted beneath the vocal cords.  The patient was noted to have  bilateral labial lacerations.  There were no vaginal, perineal, or cervical  lacerations.   DESCRIPTION OF PROCEDURE:  The patient was placed in the lithotomy position.  A Foley catheter had been  previously placed, and the Foley catheter was  removed.  The perineum was prepped with multiple layers of Betadine and then  sterilely draped.  The Kiwi vacuum extractor was placed on the fetal head.  The cervix was completely dilated and 100% effaced.  The crown of the head  was visible at the introitus without spreading the labia.  The Kiwi vacuum  extractor was applied to the fetal head.  The patient was allowed to push.  We were able to deliver the head. The mouth and nose were then suctioned  using the DeLee trap.  The remainder of the infant was delivered.  The cord  was clamped and cut, and the infant was handed to the awaiting pediatric  team.  Routine cord blood studies were obtained.  The placenta was removed.  The placenta was noted to have three vessels and the membranes were noted to  be intact.  The placental surface was noted to be intact.  The lacerations  were repaired using interrupted and running sutures of 2-0 Vicryl.  The  fundus of the uterus was noted  to be firm.  Hemostasis was adequate.  The  patient was returned to the supine position in stable condition.  The infant  was allowed to remain in the room with the family for bonding.                                               [name], M.D.    AVS/MEDQ  D:  07/19/2002  T:  07/20/2002  Job:  [id number]

## 2011-02-03 ENCOUNTER — Other Ambulatory Visit (HOSPITAL_COMMUNITY)
Admission: RE | Admit: 2011-02-03 | Discharge: 2011-02-03 | Disposition: A | Discharge status: Home / Self Care | Payer: Medicaid Other | Source: Ambulatory Visit | Attending: Family Medicine | Admitting: Family Medicine

## 2011-02-03 ENCOUNTER — Other Ambulatory Visit: Payer: Self-pay

## 2011-02-03 DIAGNOSIS — Z01419 Encounter for gynecological examination (general) (routine) without abnormal findings: Secondary | ICD-10-CM | POA: Insufficient documentation

## 2011-08-15 ENCOUNTER — Encounter (HOSPITAL_COMMUNITY): Payer: Self-pay | Admitting: Emergency Medicine

## 2011-08-15 ENCOUNTER — Emergency Department (HOSPITAL_COMMUNITY)
Admission: EM | Admit: 2011-08-15 | Discharge: 2011-08-15 | Disposition: A | Discharge status: Home / Self Care | Payer: Medicaid Other | Attending: Emergency Medicine | Admitting: Emergency Medicine

## 2011-08-15 DIAGNOSIS — N946 Dysmenorrhea, unspecified: Secondary | ICD-10-CM | POA: Insufficient documentation

## 2011-08-15 DIAGNOSIS — R109 Unspecified abdominal pain: Secondary | ICD-10-CM | POA: Insufficient documentation

## 2011-08-15 DIAGNOSIS — M549 Dorsalgia, unspecified: Secondary | ICD-10-CM | POA: Insufficient documentation

## 2011-08-15 DIAGNOSIS — R110 Nausea: Secondary | ICD-10-CM | POA: Insufficient documentation

## 2011-08-15 LAB — URINALYSIS, ROUTINE W REFLEX MICROSCOPIC
Leukocytes, UA: NEGATIVE
Nitrite: NEGATIVE
Protein, ur: NEGATIVE mg/dL
Specific Gravity, Urine: 1.022 (ref 1.005–1.030)
Urobilinogen, UA: 0.2 mg/dL (ref 0.0–1.0)

## 2011-08-15 LAB — BASIC METABOLIC PANEL
CO2: 25 mEq/L (ref 19–32)
Calcium: 9.4 mg/dL (ref 8.4–10.5)
GFR calc non Af Amer: >90 mL/min (ref >90)
Potassium: 3.9 mEq/L (ref 3.5–5.1)
Sodium: 140 mEq/L (ref 135–145)

## 2011-08-15 LAB — DIFFERENTIAL
Basophils Absolute: 0 10*3/uL (ref 0.0–0.1)
Eosinophils Relative: 2 % (ref 0–5)
Lymphocytes Relative: 36 % (ref 12–46)
Neutrophils Relative %: 53 % (ref 43–77)

## 2011-08-15 LAB — WET PREP, GENITAL
Trich, Wet Prep: NONE SEEN
Yeast Wet Prep HPF POC: NONE SEEN

## 2011-08-15 LAB — CBC
MCV: 95.2 fL (ref 78.0–100.0)
Platelets: 210 10*3/uL (ref 150–400)
RBC: 3.93 MIL/uL (ref 3.87–5.11)
RDW: 13 % (ref 11.5–15.5)
WBC: 4 10*3/uL (ref 4.0–10.5)

## 2011-08-15 LAB — POCT PREGNANCY, URINE: Preg Test, Ur: NEGATIVE

## 2011-08-15 LAB — URINE MICROSCOPIC-ADD ON

## 2011-08-15 MED ORDER — SODIUM CHLORIDE 0.9 % IV SOLN: Freq: Once | INTRAVENOUS | Status: DC

## 2011-08-15 MED ORDER — ONDANSETRON HCL 4 MG/2ML IJ SOLN
4.0000 mg | Freq: Once | INTRAMUSCULAR | Status: AC
  Administered 2011-08-15: 4 mg via INTRAVENOUS
  Filled 2011-08-15: qty 2

## 2011-08-15 MED ORDER — HYDROCODONE-ACETAMINOPHEN 5-325 MG PO TABS: 1.0000 | ORAL_TABLET | ORAL | Status: AC | PRN

## 2011-08-15 MED ORDER — SODIUM CHLORIDE 0.9 % IV BOLUS (SEPSIS)
1000.0000 mL | Freq: Once | INTRAVENOUS | Status: AC
  Administered 2011-08-15: 1000 mL via INTRAVENOUS

## 2011-08-15 MED ORDER — MORPHINE SULFATE 4 MG/ML IJ SOLN
4.0000 mg | Freq: Once | INTRAMUSCULAR | Status: AC
  Administered 2011-08-15: 4 mg via INTRAVENOUS
  Filled 2011-08-15: qty 1

## 2011-08-15 NOTE — ED Provider Notes (Signed)
History     CSN: [id number]  Arrival date & time 08/15/11  0614   First MD Initiated Contact with Patient 08/15/11 [phone]      Chief Complaint  Patient presents with  . Abdominal Pain    (Consider location/radiation/quality/duration/timing/severity/associated sxs/prior treatment) Patient is a 26 y.o. female presenting with abdominal pain. The history is provided by the patient.  Abdominal Pain The primary symptoms of the illness include abdominal pain and nausea. The primary symptoms of the illness do not include fever, shortness of breath or vomiting.  Additional symptoms associated with the illness include back pain. Symptoms associated with the illness do not include chills.   26 year old female with no pertinent past medical history presents with abdominal pain and back pain which she states awoke her from sleep this morning. She has had no recent change in physical activity or known trauma to the area. States the pain to her back is more constant it is described as sharp and stabbing in nature. She states "it felt like when I was in labor before." Abdominal pain is located in the lower abdomen and is more intermittent in nature. She's had associated nausea without emesis. Last bowel movement was Friday was normal. Denies urinary symptoms, vaginal discharge. Denies fever, chills. Last menstrual period was the first week of February and was slightly lighter than usual. Patient states that she does not normally have regular menstrual periods. Was able to eat and drank normally yesterday.  History reviewed. No pertinent past medical history.  History reviewed. No pertinent past surgical history.  No family history on file.  History  Substance Use Topics  . Smoking status: Never Smoker   . Smokeless tobacco: Not on file  . Alcohol Use: Yes    OB History    Grav Para Term Preterm Abortions TAB SAB Ect Mult Living                  Review of Systems  Constitutional: Negative for  fever, chills, activity change and appetite change.  HENT: Negative.   Eyes: Negative.   Respiratory: Negative for chest tightness and shortness of breath.   Cardiovascular: Negative for chest pain and palpitations.  Gastrointestinal: Positive for nausea and abdominal pain. Negative for vomiting.  Genitourinary: Negative.   Musculoskeletal: Positive for back pain.  Skin: Negative.   Neurological: Negative for dizziness and weakness.    Allergies  Amoxicillin and Ibuprofen  Home Medications   Current Outpatient Rx  Name Route Sig Dispense Refill  . ETONOGESTREL 68 MG [place] IMPL Subcutaneous Inject 1 each into the skin once.      BP 105/75  Pulse 111  Temp(Src) 98.7 F (37.1 C) (Oral)  Resp 16  SpO2 100%  LMP 07/13/2011  Physical Exam  Nursing note and vitals reviewed. Constitutional: She appears well-developed and well-nourished. No distress.  HENT:  Head: Normocephalic and atraumatic.  Right Ear: External ear normal.  Left Ear: External ear normal.  Mouth/Throat: Oropharynx is clear and moist. No oropharyngeal exudate.  Eyes: EOM are normal. Pupils are equal, round, and reactive to light.  Neck: Normal range of motion.  Cardiovascular: Normal rate, regular rhythm and normal heart sounds.   Pulmonary/Chest: Effort normal and breath sounds normal. She exhibits no tenderness.  Abdominal: Soft. Bowel sounds are normal. She exhibits no distension and no mass. There is no rebound and no guarding.       Mildly tender to palpation over midline suprapubic area. Negative CVA tenderness.  Genitourinary:  Chaperone present during exam. Normal external genitalia. Blood noted in vaginal vault. Cervix does not appear friable. No cervical motion tenderness. On bimanual exam, midline tenderness. No ovarian masses or tenderness.  Musculoskeletal: Normal range of motion.  Neurological: She is alert.  Skin: Skin is warm and dry. No rash noted. She is not diaphoretic.  Psychiatric: She  has a normal mood and affect.    ED Course  Procedures (including critical care time)  Labs Reviewed  URINALYSIS, ROUTINE W REFLEX MICROSCOPIC - Abnormal; Notable for the following:    APPearance CLOUDY (*)    Hgb urine dipstick LARGE (*)    All other components within normal limits  WET PREP, GENITAL - Abnormal; Notable for the following:    Clue Cells Wet Prep HPF POC MANY (*)    WBC, Wet Prep HPF POC FEW (*)    All other components within normal limits  URINE MICROSCOPIC-ADD ON - Abnormal; Notable for the following:    Squamous Epithelial / LPF MANY (*)    Bacteria, UA MANY (*)    All other components within normal limits  CBC  DIFFERENTIAL  BASIC METABOLIC PANEL  POCT PREGNANCY, URINE  GC/CHLAMYDIA PROBE AMP, GENITAL   No results found.   1. Dysmenorrhea       MDM  Patient with abdominal and back pain which started this morning. Nontoxic appearing, vital signs stable. Pelvic exam shows blood in the vaginal vault. Labs unremarkable. Suspect that her pain may be secondary to menses. Findings were discussed with patient. Return precautions discussed.       Grant Fontana, [place] 08/15/11 1453

## 2011-08-15 NOTE — ED Notes (Signed)
Pt aware of needed void pt states she is unable to void at this time

## 2011-08-15 NOTE — ED Provider Notes (Signed)
Complains of low abdominal pain radiating to left flank onset 5 AM today awakened her from sleep . Abdominal pain is intermittent lasting 10 minutes at a time flank pain is constant. Pain feels similar to pain she had when she was in labor in the past no treatment prior to coming here admits to nausea no vomiting last menstrual period first week of February. Reports "it was not normal but my periods are never normal". Denies vaginal discharge last bowel movement 2 days ago normal no anorexia no treatment prior to coming here On exam alert nontoxic lungs clear auscultation heart regular rate and rhythm abdomen nondistended normal active bowel sounds tender at infraumbilical area no guarding rigidity or rebound left flank is tender, mildly  Doug Sou, MD 08/15/11 [phone]

## 2011-08-15 NOTE — ED Notes (Signed)
PT. WOKE UP THIS MORNING WITH LOW ABDOMINAL CRAMPING AND LEFT LOW BACK PAIN WITH NAUSEA AND CHILLS.

## 2011-08-15 NOTE — ED Provider Notes (Signed)
Medical screening examination/treatment/procedure(s) were conducted as a shared visit with non-physician practitioner(s) and myself.  I personally evaluated the patient during the encounter  Doug Sou, MD 08/15/11 1944

## 2011-08-15 NOTE — ED Notes (Signed)
Pelvic cart at the bedside 

## 2011-08-15 NOTE — ED Notes (Signed)
Pt resting quietly at the time. Up to bathroom without difficulty. States she feels better after receiving pain medication. 3/10 left lower back pain. She is alert and oriented x4. No signs of distress noted. Friend remains at bedside.

## 2011-08-15 NOTE — Discharge Instructions (Signed)
Your labs today were normal. There is no evidence of a UTI. Your pelvic exam showed that you're likely starting your menstrual period. This may be causing your pain. Please use warm compresses over the lower abdomen to help with the pain. Make a followup appointment with your primary care doctor if needed. Return to the ER for worsening condition.

## 2011-08-15 NOTE — ED Notes (Signed)
Pt discharged home, encouraged to follow up with PCP. Had no further questions.

## 2011-08-15 NOTE — ED Notes (Signed)
Pt c/o sudden onset of "stomach pain" and left lower back pain that woke her from sleep at 5 am this morning.  States she has nausea, no vomiting.  Denies urinary frequency/burning/smell.  States no vaginal discharge.  LMP "first week of February", last [facility] Friday.

## 2011-08-16 LAB — GC/CHLAMYDIA PROBE AMP, GENITAL: GC Probe Amp, Genital: NEGATIVE

## 2012-01-31 ENCOUNTER — Ambulatory Visit: Payer: Medicaid Other | Admitting: Physical Therapy

## 2012-03-18 ENCOUNTER — Emergency Department (HOSPITAL_COMMUNITY)
Admission: EM | Admit: 2012-03-18 | Discharge: 2012-03-18 | Disposition: A | Discharge status: Home / Self Care | Payer: Self-pay | Attending: Emergency Medicine | Admitting: Emergency Medicine

## 2012-03-18 ENCOUNTER — Encounter (HOSPITAL_COMMUNITY): Payer: Self-pay | Admitting: *Deleted

## 2012-03-18 DIAGNOSIS — Z881 Allergy status to other antibiotic agents status: Secondary | ICD-10-CM | POA: Insufficient documentation

## 2012-03-18 DIAGNOSIS — Z888 Allergy status to other drugs, medicaments and biological substances status: Secondary | ICD-10-CM | POA: Insufficient documentation

## 2012-03-18 DIAGNOSIS — T7840XA Allergy, unspecified, initial encounter: Secondary | ICD-10-CM | POA: Insufficient documentation

## 2012-03-18 DIAGNOSIS — R21 Rash and other nonspecific skin eruption: Secondary | ICD-10-CM | POA: Insufficient documentation

## 2012-03-18 DIAGNOSIS — X58XXXA Exposure to other specified factors, initial encounter: Secondary | ICD-10-CM | POA: Insufficient documentation

## 2012-03-18 MED ORDER — METHYLPREDNISOLONE SODIUM SUCC 125 MG IJ SOLR
125.0000 mg | Freq: Once | INTRAMUSCULAR | Status: AC
  Administered 2012-03-18: 125 mg via INTRAVENOUS
  Filled 2012-03-18: qty 2

## 2012-03-18 MED ORDER — DIPHENHYDRAMINE HCL 50 MG/ML IJ SOLN
25.0000 mg | Freq: Once | INTRAMUSCULAR | Status: AC
  Administered 2012-03-18: 25 mg via INTRAVENOUS
  Filled 2012-03-18: qty 1

## 2012-03-18 MED ORDER — FAMOTIDINE 20 MG PO TABS: 20.0000 mg | ORAL_TABLET | Freq: Two times a day (BID) | ORAL | Status: DC

## 2012-03-18 MED ORDER — DIPHENHYDRAMINE HCL 25 MG PO CAPS: 25.0000 mg | ORAL_CAPSULE | Freq: Four times a day (QID) | ORAL | Status: DC | PRN

## 2012-03-18 MED ORDER — FAMOTIDINE 20 MG PO TABS
20.0000 mg | ORAL_TABLET | Freq: Once | ORAL | Status: AC
  Administered 2012-03-18: 20 mg via ORAL
  Filled 2012-03-18: qty 1

## 2012-03-18 MED ORDER — PREDNISONE 20 MG PO TABS: 60.0000 mg | ORAL_TABLET | Freq: Every day | ORAL | Status: DC

## 2012-03-18 NOTE — ED Notes (Signed)
Pt states that 35 minutes ago noticed rash to upper right shoulder and back, large hives.   No respiratory distress.

## 2012-03-18 NOTE — ED Provider Notes (Signed)
History     CSN: [id number]  Arrival date & time 03/18/12  4098   First MD Initiated Contact with Patient 03/18/12 [phone]      Chief Complaint  Patient presents with  . Allergic Reaction    (Consider location/radiation/quality/duration/timing/severity/associated sxs/prior treatment) HPI Hx per PT, has known allergies to ibuprofen and Amoxicillin but no recent exposures, tonight developed itchy rash to R shoulder and back. She had just picked up a newspaper that contained pink ink.  No SOB or wheezes. No oral involvement. No trouble swallowing. No new foods or known exposures otherwise. Mod in severity. History reviewed. No pertinent past medical history.  Past Surgical History  Procedure Date  . Hernia repair     2003    History reviewed. No pertinent family history.  History  Substance Use Topics  . Smoking status: Never Smoker   . Smokeless tobacco: Not on file  . Alcohol Use: Yes    OB History    Grav Para Term Preterm Abortions TAB SAB Ect Mult Living                  Review of Systems  Constitutional: Negative for fever and chills.  HENT: Negative for neck pain and neck stiffness.   Eyes: Negative for pain.  Respiratory: Negative for shortness of breath.   Cardiovascular: Negative for chest pain.  Gastrointestinal: Negative for abdominal pain.  Genitourinary: Negative for dysuria.  Musculoskeletal: Negative for back pain.  Skin: Positive for rash.  Neurological: Negative for headaches.  All other systems reviewed and are negative.    Allergies  Amoxicillin and Ibuprofen  Home Medications   Current Outpatient Rx  Name Route Sig Dispense Refill  . ETONOGESTREL 68 MG [place] IMPL Subcutaneous Inject 1 each into the skin once.      BP 145/95  Pulse 77  Temp 98 F (36.7 C) (Oral)  Resp 16  SpO2 100%  Physical Exam  Nursing note and vitals reviewed. Constitutional: She is oriented to person, place, and time. She appears well-developed and  well-nourished.  HENT:  Head: Normocephalic and atraumatic.       Uvula midline, no oral swelling  Eyes: EOM are normal. Pupils are equal, round, and reactive to light.  Neck: Neck supple.  Cardiovascular: Normal heart sounds and intact distal pulses.   Pulmonary/Chest: Effort normal. No stridor. No respiratory distress.  Musculoskeletal: Normal range of motion. She exhibits no edema.  Neurological: She is alert and oriented to person, place, and time.  Skin: Skin is warm and dry.       Erythematous blanching rash to R neck, torso and back c/w hives    ED Course  Procedures (including critical care time)  IV Solumedrol, IV benadryl, PO pepcid  PT observed and no progressive symptoms. Plan d/c home Rx prednisone, benadryl and pepcid with strict return precautions, plan food diary and follow up PCP. Stable for d/c home.     MDM   Rash c/w allergic reaction. Improved with medications as above. VS and nursing notes reviewed.         Sunnie Nielsen, MD 03/18/12 0600

## 2012-10-11 ENCOUNTER — Encounter: Payer: Self-pay | Admitting: Obstetrics

## 2012-10-11 ENCOUNTER — Ambulatory Visit (INDEPENDENT_AMBULATORY_CARE_PROVIDER_SITE_OTHER): Payer: 59 | Admitting: Obstetrics

## 2012-10-11 VITALS — BP 113/79 | HR 79 | Temp 98.0°F | Ht 62.0 in | Wt 101.0 lb

## 2012-10-11 DIAGNOSIS — Z3046 Encounter for surveillance of implantable subdermal contraceptive: Secondary | ICD-10-CM

## 2012-10-11 DIAGNOSIS — Z3009 Encounter for other general counseling and advice on contraception: Secondary | ICD-10-CM

## 2012-10-11 MED ORDER — NORGESTIMATE-ETH ESTRADIOL 0.25-35 MG-MCG PO TABS: 1.0000 | ORAL_TABLET | Freq: Every day | ORAL | Status: DC

## 2012-10-11 NOTE — Patient Instructions (Signed)
Contraceptive options

## 2012-10-11 NOTE — Progress Notes (Signed)
Subjective:     [name] is a 27 y.o. female here to discuss Implanon removal.  Current complaints: none.     Gynecologic History Patient's last menstrual period was 10/04/2012. Contraception: Nexplanon Last Pap: unk. Results were: normal Last mammogram: n/a. Results were: n/a  Obstetric History OB History   Grav Para Term Preterm Abortions TAB SAB Ect Mult Living                   The following portions of the patient's history were reviewed and updated as appropriate: allergies, current medications, past family history, past medical history, past social history, past surgical history and problem list.  Review of Systems Pertinent items are noted in HPI.    Objective:    General appearance: alert and no distress Extremities: Left Arm:                            Rod palpated, area prepped and draped in routine sterile fashion.  Area under distal tip of rod anesthestized sub Q with 1%                                          Xylocaine, incised and rod                            Removed, intact.  Bandage applied.   Assessment:    Nexplanon Removal.  Plan:    Education reviewed: safe sex/STD prevention and contraceptive options.Marland Kitchen    Sprintec 28 Rx.  F/U 2 weeks.

## 2012-11-02 ENCOUNTER — Ambulatory Visit: Payer: 59 | Admitting: Obstetrics

## 2012-11-08 ENCOUNTER — Ambulatory Visit (INDEPENDENT_AMBULATORY_CARE_PROVIDER_SITE_OTHER): Payer: 59 | Admitting: Obstetrics

## 2012-11-08 ENCOUNTER — Encounter: Payer: Self-pay | Admitting: Obstetrics

## 2012-11-08 VITALS — BP 117/77 | HR 67 | Temp 98.2°F | Ht 62.0 in | Wt 100.8 lb

## 2012-11-08 DIAGNOSIS — Z113 Encounter for screening for infections with a predominantly sexual mode of transmission: Secondary | ICD-10-CM

## 2012-11-08 DIAGNOSIS — N760 Acute vaginitis: Secondary | ICD-10-CM | POA: Insufficient documentation

## 2012-11-08 MED ORDER — METRONIDAZOLE 0.75 % VA GEL: 1.0000 | Freq: Two times a day (BID) | VAGINAL | Status: DC

## 2012-11-08 NOTE — Patient Instructions (Signed)
+  BV

## 2012-11-08 NOTE — Addendum Note (Signed)
Addended by: Julaine Hua on: 11/08/2012 12:17 PM   Modules accepted: Orders

## 2012-11-08 NOTE — Progress Notes (Signed)
.   Subjective:     [name] is a 27 y.o. female here for a problem exam.  Current complaints:abnormal discharge with odor and burning. She had the Nexplanon removed on 10/11/12.  Personal health questionnaire reviewed: yes.   Gynecologic History Patient's last menstrual period was 10/23/2012. Contraception: OCP (estrogen/progesterone) Last Pap: 2012. Results were: normal  Obstetric History OB History   Grav Para Term Preterm Abortions TAB SAB Ect Mult Living                   The following portions of the patient's history were reviewed and updated as appropriate: allergies, current medications, past family history, past medical history, past social history, past surgical history and problem list.  Review of Systems Pertinent items are noted in HPI.    Objective:    General appearance: alert and no distress Abdomen: normal findings: soft, non-tender Pelvic: cervix normal in appearance, external genitalia normal and vagina with gray thin discharge.    Assessment:    Healthy female exam.   Education reviewed: safe sex/STD prevention and management of BV.Marland Kitchen    Plan:       [facility] Rx.       Annual next visit.

## 2012-11-09 LAB — GC/CHLAMYDIA PROBE AMP
CT Probe RNA: NEGATIVE
GC Probe RNA: NEGATIVE

## 2012-11-09 LAB — WET PREP BY MOLECULAR PROBE
Candida species: NEGATIVE
Gardnerella vaginalis: POSITIVE — AB
Trichomonas vaginosis: NEGATIVE

## 2013-03-05 ENCOUNTER — Encounter (HOSPITAL_COMMUNITY): Payer: Self-pay

## 2013-03-05 ENCOUNTER — Emergency Department (INDEPENDENT_AMBULATORY_CARE_PROVIDER_SITE_OTHER)
Admission: EM | Admit: 2013-03-05 | Discharge: 2013-03-05 | Disposition: A | Discharge status: Home / Self Care | Payer: 59 | Source: Home / Self Care | Attending: Family Medicine | Admitting: Family Medicine

## 2013-03-05 DIAGNOSIS — R071 Chest pain on breathing: Secondary | ICD-10-CM

## 2013-03-05 DIAGNOSIS — R0789 Other chest pain: Secondary | ICD-10-CM

## 2013-03-05 MED ORDER — TRAMADOL HCL 50 MG PO TABS: 50.0000 mg | ORAL_TABLET | Freq: Four times a day (QID) | ORAL | Status: DC | PRN

## 2013-03-05 NOTE — ED Provider Notes (Signed)
CSN: [id number]     Arrival date & time 03/05/13  1600 History   First MD Initiated Contact with Patient 03/05/13 1638     Chief Complaint  Patient presents with  . Chest Pain   (Consider location/radiation/quality/duration/timing/severity/associated sxs/prior Treatment) Patient is a 27 y.o. female presenting with chest pain. The history is provided by the patient.  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 Relieved by:  None tried Worsened by:  Nothing tried Associated symptoms: no abdominal pain, no back pain, no cough, no fever, no heartburn, no palpitations and no shortness of breath     Past Medical History  Diagnosis Date  . Anemia    Past Surgical History  Procedure Laterality Date  . Hernia repair      2003   History reviewed. No pertinent family history. History  Substance Use Topics  . Smoking status: Never Smoker   . Smokeless tobacco: Never Used  . Alcohol Use: Yes     Comment: occ   OB History   Grav Para Term Preterm Abortions TAB SAB Ect Mult Living                 Review of Systems  Constitutional: Negative.  Negative for fever.  Respiratory: Negative for cough, chest tightness and shortness of breath.   Cardiovascular: Positive for chest pain. Negative for palpitations and leg swelling.  Gastrointestinal: Negative for heartburn and abdominal pain.  Musculoskeletal: Negative for back pain.    Allergies  Amoxicillin and Ibuprofen  Home Medications   Current Outpatient Rx  Name  Route  Sig  Dispense  Refill  . diphenhydrAMINE (BENADRYL) 25 mg capsule   Oral   Take 1 capsule (25 mg total) by mouth every 6 (six) hours as needed for itching.   30 capsule   0   .  ferrous sulfate 325 (65 FE) MG tablet   Oral   Take 325 mg by mouth daily with breakfast.         . metroNIDAZOLE (METROGEL VAGINAL) 0.75 % vaginal gel   Vaginal   Place 1 Applicatorful vaginally 2 (two) times daily.   70 g   0   . norgestimate-ethinyl estradiol (ORTHO-CYCLEN,SPRINTEC,PREVIFEM) 0.25-35 MG-MCG tablet   Oral   Take 1 tablet by mouth daily.   1 Package   11   . traMADol (ULTRAM) 50 MG tablet   Oral   Take 1 tablet (50 mg total) by mouth every 6 (six) hours as needed for pain.   15 tablet   0    BP 117/80  Pulse 76  Temp(Src) 98.2 F (36.8 C) (Oral)  Resp 16  SpO2 100%  LMP 03/01/2013 Physical Exam  Nursing note and vitals reviewed. Constitutional: She is oriented to person, place, and time. She appears well-developed and well-nourished. No distress.  HENT:  Mouth/Throat: Oropharynx is clear and moist.  Neck: Normal range of motion. Neck supple.  Cardiovascular: Normal rate, regular rhythm, normal heart sounds and intact distal pulses.   Pulmonary/Chest: She exhibits tenderness.  Abdominal: Soft. Bowel sounds are normal.  Musculoskeletal: Normal range of motion. She exhibits no edema.  Lymphadenopathy:    She has no cervical adenopathy.  Neurological:  She is alert and oriented to person, place, and time.  Skin: Skin is warm and dry.    ED Course  Procedures (including critical care time) Labs Review Labs Reviewed - No data to display Imaging Review No results found.  MDM       Linna Hoff, MD 03/05/13 1745

## 2013-03-05 NOTE — ED Notes (Signed)
Works at night, went home from work to go to bed to sleep, no problems at bedtime; woke a few minutes earlier than usual this PM w pain in her chest . Pain described as sharp in nature, worse w direct palpation, movement, inspiration. NAD at present, w/d/color good

## 2013-03-05 NOTE — ED Notes (Signed)
Was asked to evaluate this pt on arrival for CC "sharp chest pain". Pain reportedly onset this AM, worse w breathing, movement, direct palpation of costochondral margin; sufficiently stable to wait for later available exam room

## 2013-03-12 ENCOUNTER — Ambulatory Visit: Payer: 59 | Admitting: Obstetrics

## 2013-06-07 NOTE — L&D Delivery Note (Signed)
Delivery Note At 4:46 AM a viable female was delivered via Vaginal, Spontaneous Delivery (Presentation: Left Occiput Anterior).  APGAR: 9, 9; weight:  3187 grams .   Placenta status: Intact, Spontaneous.  Cord: 3 vessels with the following complications: None.  Cord pH: none  Anesthesia: Epidural  Episiotomy: None Lacerations: None Suture Repair: none Est. Blood Loss (mL): 350  Mom to postpartum.  Baby to Couplet care / Skin to Skin.  Tahani Potier A 12/22/2013, 5:59 AM

## 2013-07-20 ENCOUNTER — Encounter: Payer: Self-pay | Admitting: Obstetrics & Gynecology

## 2013-07-20 ENCOUNTER — Ambulatory Visit (INDEPENDENT_AMBULATORY_CARE_PROVIDER_SITE_OTHER): Payer: Medicaid Other | Admitting: Obstetrics & Gynecology

## 2013-07-20 VITALS — BP 102/70 | Temp 98.4°F | Wt 114.0 lb

## 2013-07-20 DIAGNOSIS — Z3201 Encounter for pregnancy test, result positive: Secondary | ICD-10-CM

## 2013-07-20 DIAGNOSIS — Z3480 Encounter for supervision of other normal pregnancy, unspecified trimester: Secondary | ICD-10-CM | POA: Insufficient documentation

## 2013-07-20 LAB — POCT URINALYSIS DIPSTICK
Bilirubin, UA: NEGATIVE
Blood, UA: NEGATIVE
Glucose, UA: NEGATIVE
KETONES UA: NEGATIVE
Leukocytes, UA: NEGATIVE
Nitrite, UA: NEGATIVE
PH UA: 6
PROTEIN UA: NEGATIVE
SPEC GRAV UA: 1.01
UROBILINOGEN UA: NEGATIVE

## 2013-07-20 LAB — POCT URINE PREGNANCY: Preg Test, Ur: POSITIVE

## 2013-07-20 NOTE — Progress Notes (Signed)
Pulse- 86 Pt states she id having lower back pain.  Subjective:    [name] Standing is being seen today for her first obstetrical visit.  This is not a planned pregnancy. She is at 7640w3d gestation. Her obstetrical history is significant for none. Relationship with FOB: FOB and patient are not together. . Patient does intend to breast feed. Pregnancy history fully reviewed.  Menstrual History: OB History   Grav Para Term Preterm Abortions TAB SAB Ect Mult Living   6 3 3  2     3       Last Pap: 2013 WNL Menarche age: [age]  Patient's last menstrual period was 03/27/2013.    The following portions of the patient's history were reviewed and updated as appropriate: allergies, current medications, past family history, past medical history, past social history, past surgical history and problem list.  Review of Systems Pertinent items are noted in HPI.    Objective:   General Appearance:    Alert, cooperative, no distress, appears stated age  Head:    Normocephalic, without obvious abnormality, atraumatic  Eyes:    PERRL, conjunctiva/corneas clear, EOM's intact, fundi    benign, both eyes  Ears:    Normal TM's and external ear canals, both ears  Nose:   Nares normal, septum midline, mucosa normal, no drainage    or sinus tenderness  Throat:   Lips, mucosa, and tongue normal; teeth and gums normal  Neck:   Supple, symmetrical, trachea midline, no adenopathy;    thyroid:  no enlargement/tenderness/nodules; no carotid   bruit or JVD  Back:     Symmetric, no curvature, ROM normal, no CVA tenderness  Lungs:     Clear to auscultation bilaterally, respirations unlabored  Chest Wall:    No tenderness or deformity   Heart:    Regular rate and rhythm, S1 and S2 normal, no murmur, rub   or gallop  Breast Exam:    No tenderness, masses, or nipple abnormality  Abdomen:     Soft, non-tender, bowel sounds active all four quadrants,    no masses, no organomegaly  Genitalia:    Normal female without  lesion, discharge or tenderness  Extremities:   Extremities normal, atraumatic, no cyanosis or edema  Pulses:   2+ and symmetric all extremities  Skin:   Skin color, texture, turgor normal, no rashes or lesions  Lymph nodes:   Cervical, supraclavicular, and axillary nodes normal  Neurologic:   CNII-XII intact, normal strength, sensation and reflexes    throughout    Assessment:    Pregnancy at 2540w3d weeks    Plan:    Initial labs drawn. Prenatal vitamins.  Counseling provided regarding continued use of seat belts, cessation of alcohol consumption, smoking or use of illicit drugs; infection precautions i.e., influenza/TDAP immunizations, toxoplasmosis,CMV, parvovirus, listeria and varicella; workplace safety, exercise during pregnancy; routine dental care, safe medications, sexual activity, hot tubs, saunas, pools, travel, caffeine use, fish and methlymercury, potential toxins, hair treatments, varicose veins Weight gain recommendations per IOM guidelines reviewed:  normal weight/BMI 18.5 - 24.9--> gain 25 - 35 lbs Problem list reviewed and updated. CF mutation testing QUAD SCREEN discussed. Role of ultrasound in pregnancy discussed; fetal survey. Amniocentesis discussed: not indicated. VBAC calculator score: VBAC consent form provided Follow up in 6 weeks. 50% of 20 min visit spent on counseling and coordination of care.

## 2013-07-20 NOTE — Progress Notes (Deleted)
Prenatal samples given at today visit.

## 2013-07-21 LAB — OBSTETRIC PANEL
Antibody Screen: NEGATIVE
Basophils Absolute: 0 10*3/uL (ref 0.0–0.1)
Basophils Relative: 0 % (ref 0–1)
EOS ABS: 0.1 10*3/uL (ref 0.0–0.7)
EOS PCT: 1 % (ref 0–5)
HEMATOCRIT: 34.3 % — AB (ref 36.0–46.0)
HEMOGLOBIN: 11.7 g/dL — AB (ref 12.0–15.0)
Hepatitis B Surface Ag: NEGATIVE
LYMPHS ABS: 1.2 10*3/uL (ref 0.7–4.0)
LYMPHS PCT: 18 % (ref 12–46)
MCH: 32.9 pg (ref 26.0–34.0)
MCHC: 34.1 g/dL (ref 30.0–36.0)
MCV: 96.3 fL (ref 78.0–100.0)
MONOS PCT: 7 % (ref 3–12)
Monocytes Absolute: 0.5 10*3/uL (ref 0.1–1.0)
Neutro Abs: 4.9 10*3/uL (ref 1.7–7.7)
Neutrophils Relative %: 74 % (ref 43–77)
PLATELETS: 246 10*3/uL (ref 150–400)
RBC: 3.56 MIL/uL — AB (ref 3.87–5.11)
RDW: 14.2 % (ref 11.5–15.5)
RH TYPE: POSITIVE
RUBELLA: 1.47 {index} — AB (ref <0.90)
WBC: 6.6 10*3/uL (ref 4.0–10.5)

## 2013-07-21 LAB — VARICELLA ZOSTER ANTIBODY, IGG: Varicella IgG: 424.2 Index — ABNORMAL HIGH (ref <135.00)

## 2013-07-21 LAB — HIV ANTIBODY (ROUTINE TESTING W REFLEX): HIV: NONREACTIVE

## 2013-07-21 LAB — VITAMIN D 25 HYDROXY (VIT D DEFICIENCY, FRACTURES): VIT D 25 HYDROXY: 16 ng/mL — AB (ref 30–89)

## 2013-07-22 LAB — CULTURE, OB URINE
COLONY COUNT: NO GROWTH
ORGANISM ID, BACTERIA: NO GROWTH

## 2013-07-23 LAB — AFP, QUAD SCREEN
AFP: 29.5 [IU]/mL
Age Alone: 1:891 {titer}
Curr Gest Age: 16.3 wks.days
Down Syndrome Scr Risk Est: 1:12900 {titer}
HCG TOTAL: 20532 m[IU]/mL
INH: 129.9 pg/mL
INTERPRETATION-AFP: NEGATIVE
MOM FOR AFP: 0.72
MoM for INH: 0.64
MoM for hCG: 0.67
OPEN SPINA BIFIDA: NEGATIVE
Osb Risk: <1:54600 {titer}
TRI 18 SCR RISK EST: NEGATIVE
Trisomy 18 (Edward) Syndrome Interp.: 1:28600 {titer}
UE3 MOM: 1.37
UE3 VALUE: 0.7 ng/mL

## 2013-07-23 LAB — PAP IG, CT-NG, RFX HPV ASCU
CHLAMYDIA PROBE AMP: NEGATIVE
GC Probe Amp: NEGATIVE

## 2013-07-23 NOTE — Patient Instructions (Signed)
Second Trimester of Pregnancy The second trimester is from week 13 through week 28, months 4 through 6. The second trimester is often a time when you feel your best. Your body has also adjusted to being pregnant, and you begin to feel better physically. Usually, morning sickness has lessened or quit completely, you may have more energy, and you may have an increase in appetite. The second trimester is also a time when the fetus is growing rapidly. At the end of the sixth month, the fetus is about 9 inches long and weighs about 1 pounds. You will likely begin to feel the baby move (quickening) between 18 and 20 weeks of the pregnancy. BODY CHANGES Your body goes through many changes during pregnancy. The changes vary from woman to woman.   Your weight will continue to increase. You will notice your lower abdomen bulging out.  You may begin to get stretch marks on your hips, abdomen, and breasts.  You may develop headaches that can be relieved by medicines approved by your caregiver.  You may urinate more often because the fetus is pressing on your bladder.  You may develop or continue to have heartburn as a result of your pregnancy.  You may develop constipation because certain hormones are causing the muscles that push waste through your intestines to slow down.  You may develop hemorrhoids or swollen, bulging veins (varicose veins).  You may have back pain because of the weight gain and pregnancy hormones relaxing your joints between the bones in your pelvis and as a result of a shift in weight and the muscles that support your balance.  Your breasts will continue to grow and be tender.  Your gums may bleed and may be sensitive to brushing and flossing.  Dark spots or blotches (chloasma, mask of pregnancy) may develop on your face. This will likely fade after the baby is born.  A dark line from your belly button to the pubic area (linea nigra) may appear. This will likely fade after the  baby is born. WHAT TO EXPECT AT YOUR PRENATAL VISITS During a routine prenatal visit:  You will be weighed to make sure you and the fetus are growing normally.  Your blood pressure will be taken.  Your abdomen will be measured to track your baby's growth.  The fetal heartbeat will be listened to.  Any test results from the previous visit will be discussed. Your caregiver may ask you:  How you are feeling.  If you are feeling the baby move.  If you have had any abnormal symptoms, such as leaking fluid, bleeding, severe headaches, or abdominal cramping.  If you have any questions. Other tests that may be performed during your second trimester include:  Blood tests that check for:  Low iron levels (anemia).  Gestational diabetes (between 24 and 28 weeks).  Rh antibodies.  Urine tests to check for infections, diabetes, or protein in the urine.  An ultrasound to confirm the proper growth and development of the baby.  An amniocentesis to check for possible genetic problems.  Fetal screens for spina bifida and Down syndrome. HOME CARE INSTRUCTIONS   Avoid all smoking, herbs, alcohol, and unprescribed drugs. These chemicals affect the formation and growth of the baby.  Follow your caregiver's instructions regarding medicine use. There are medicines that are either safe or unsafe to take during pregnancy.  Exercise only as directed by your caregiver. Experiencing uterine cramps is a good sign to stop exercising.  Continue to eat regular,   healthy meals.  Wear a good support bra for breast tenderness.  Do not use hot tubs, steam rooms, or saunas.  Wear your seat belt at all times when driving.  Avoid raw meat, uncooked cheese, cat litter boxes, and soil used by cats. These carry germs that can cause birth defects in the baby.  Take your prenatal vitamins.  Try taking a stool softener (if your caregiver approves) if you develop constipation. Eat more high-fiber foods,  such as fresh vegetables or fruit and whole grains. Drink plenty of fluids to keep your urine clear or pale yellow.  Take warm sitz baths to soothe any pain or discomfort caused by hemorrhoids. Use hemorrhoid cream if your caregiver approves.  If you develop varicose veins, wear support hose. Elevate your feet for 15 minutes, 3 4 times a day. Limit salt in your diet.  Avoid heavy lifting, wear low heel shoes, and practice good posture.  Rest with your legs elevated if you have leg cramps or low back pain.  Visit your dentist if you have not gone yet during your pregnancy. Use a soft toothbrush to brush your teeth and be gentle when you floss.  A sexual relationship may be continued unless your caregiver directs you otherwise.  Continue to go to all your prenatal visits as directed by your caregiver. SEEK MEDICAL CARE IF:   You have dizziness.  You have mild pelvic cramps, pelvic pressure, or nagging pain in the abdominal area.  You have persistent nausea, vomiting, or diarrhea.  You have a bad smelling vaginal discharge.  You have pain with urination. SEEK IMMEDIATE MEDICAL CARE IF:   You have a fever.  You are leaking fluid from your vagina.  You have spotting or bleeding from your vagina.  You have severe abdominal cramping or pain.  You have rapid weight gain or loss.  You have shortness of breath with chest pain.  You notice sudden or extreme swelling of your face, hands, ankles, feet, or legs.  You have not felt your baby move in over an hour.  You have severe headaches that do not go away with medicine.  You have vision changes. Document Released: 05/18/2001 Document Revised: 01/24/2013 Document Reviewed: 07/25/2012 [employer] Patient Information 2014 [employer].  

## 2013-07-24 LAB — HEMOGLOBINOPATHY EVALUATION
HEMOGLOBIN OTHER: 0 %
HGB S QUANTITAION: 0 %
Hgb A2 Quant: 2.9 % (ref 2.2–3.2)
Hgb A: 97.1 % (ref 96.8–97.8)
Hgb F Quant: 0 % (ref 0.0–2.0)

## 2013-07-26 ENCOUNTER — Other Ambulatory Visit: Payer: Self-pay | Admitting: *Deleted

## 2013-07-26 ENCOUNTER — Encounter: Payer: Self-pay | Admitting: Obstetrics & Gynecology

## 2013-07-26 DIAGNOSIS — E559 Vitamin D deficiency, unspecified: Secondary | ICD-10-CM | POA: Insufficient documentation

## 2013-07-26 DIAGNOSIS — Z1389 Encounter for screening for other disorder: Secondary | ICD-10-CM

## 2013-07-26 HISTORY — DX: Vitamin D deficiency, unspecified: E55.9

## 2013-07-26 NOTE — Progress Notes (Signed)
Quick Note:  Needs PNV w/vitamin D ______ 

## 2013-07-31 ENCOUNTER — Other Ambulatory Visit: Payer: 59

## 2013-08-05 ENCOUNTER — Other Ambulatory Visit: Payer: Self-pay | Admitting: *Deleted

## 2013-08-05 DIAGNOSIS — E559 Vitamin D deficiency, unspecified: Secondary | ICD-10-CM

## 2013-08-05 MED ORDER — OB COMPLETE PETITE 35-5-1-200 MG PO CAPS: 1.0000 | ORAL_CAPSULE | Freq: Every day | ORAL | Status: DC

## 2013-08-08 ENCOUNTER — Ambulatory Visit (INDEPENDENT_AMBULATORY_CARE_PROVIDER_SITE_OTHER): Payer: Medicaid Other

## 2013-08-08 DIAGNOSIS — O365990 Maternal care for other known or suspected poor fetal growth, unspecified trimester, not applicable or unspecified: Secondary | ICD-10-CM

## 2013-08-08 DIAGNOSIS — Z1389 Encounter for screening for other disorder: Secondary | ICD-10-CM

## 2013-08-09 ENCOUNTER — Encounter: Payer: Self-pay | Admitting: Obstetrics & Gynecology

## 2013-08-09 LAB — US OB DETAIL + 14 WK

## 2013-08-13 ENCOUNTER — Other Ambulatory Visit: Payer: Self-pay | Admitting: *Deleted

## 2013-08-13 DIAGNOSIS — Z1389 Encounter for screening for other disorder: Secondary | ICD-10-CM

## 2013-08-21 ENCOUNTER — Ambulatory Visit (HOSPITAL_COMMUNITY)
Admission: RE | Admit: 2013-08-21 | Discharge: 2013-08-21 | Disposition: A | Discharge status: Home / Self Care | Payer: Medicaid Other | Source: Ambulatory Visit | Attending: Obstetrics & Gynecology | Admitting: Obstetrics & Gynecology

## 2013-08-21 DIAGNOSIS — Z3689 Encounter for other specified antenatal screening: Secondary | ICD-10-CM | POA: Insufficient documentation

## 2013-08-21 DIAGNOSIS — Z1389 Encounter for screening for other disorder: Secondary | ICD-10-CM

## 2013-08-25 ENCOUNTER — Encounter: Payer: Self-pay | Admitting: Obstetrics & Gynecology

## 2013-08-25 DIAGNOSIS — O283 Abnormal ultrasonic finding on antenatal screening of mother: Secondary | ICD-10-CM | POA: Insufficient documentation

## 2013-08-30 ENCOUNTER — Ambulatory Visit (INDEPENDENT_AMBULATORY_CARE_PROVIDER_SITE_OTHER): Payer: Medicaid Other | Admitting: Obstetrics & Gynecology

## 2013-08-30 VITALS — BP 111/75 | Temp 99.8°F | Wt 121.0 lb

## 2013-08-30 DIAGNOSIS — Z3480 Encounter for supervision of other normal pregnancy, unspecified trimester: Secondary | ICD-10-CM

## 2013-08-30 LAB — POCT URINALYSIS DIPSTICK
Bilirubin, UA: NEGATIVE
Blood, UA: NEGATIVE
Glucose, UA: NEGATIVE
Ketones, UA: NEGATIVE
Leukocytes, UA: NEGATIVE
NITRITE UA: NEGATIVE
PROTEIN UA: NEGATIVE
SPEC GRAV UA: 1.025
UROBILINOGEN UA: NEGATIVE
pH, UA: 5

## 2013-08-30 NOTE — Progress Notes (Signed)
Pulse- 96 Pt states she is feeling vaginal pressure. Pt states she is getting a cramping sensation about 6-7 times a day that last for approximately 15 minutes.

## 2013-09-06 ENCOUNTER — Encounter: Payer: Medicaid Other | Admitting: Obstetrics & Gynecology

## 2013-09-17 ENCOUNTER — Encounter: Payer: Self-pay | Admitting: Obstetrics

## 2013-09-17 ENCOUNTER — Ambulatory Visit (INDEPENDENT_AMBULATORY_CARE_PROVIDER_SITE_OTHER): Payer: Medicaid Other | Admitting: Obstetrics

## 2013-09-17 VITALS — BP 98/66 | Temp 98.3°F | Wt 123.0 lb

## 2013-09-17 DIAGNOSIS — Z3480 Encounter for supervision of other normal pregnancy, unspecified trimester: Secondary | ICD-10-CM

## 2013-09-17 LAB — POCT URINALYSIS DIPSTICK
Bilirubin, UA: NEGATIVE
Glucose, UA: NEGATIVE
KETONES UA: NEGATIVE
Nitrite, UA: NEGATIVE
PH UA: 5
Spec Grav, UA: 1.025
Urobilinogen, UA: NEGATIVE

## 2013-09-17 NOTE — Progress Notes (Signed)
Pulse 86, c/o constipation and hemorrhoids

## 2013-09-21 ENCOUNTER — Other Ambulatory Visit: Payer: Self-pay | Admitting: *Deleted

## 2013-09-21 DIAGNOSIS — K649 Unspecified hemorrhoids: Secondary | ICD-10-CM

## 2013-09-21 MED ORDER — HYDROCORTISONE ACETATE 25 MG RE SUPP: 25.0000 mg | Freq: Two times a day (BID) | RECTAL | Status: DC

## 2013-09-24 ENCOUNTER — Encounter: Payer: Self-pay | Admitting: Obstetrics & Gynecology

## 2013-10-01 ENCOUNTER — Ambulatory Visit (INDEPENDENT_AMBULATORY_CARE_PROVIDER_SITE_OTHER): Payer: Medicaid Other | Admitting: Obstetrics & Gynecology

## 2013-10-01 ENCOUNTER — Other Ambulatory Visit: Payer: Medicaid Other

## 2013-10-01 ENCOUNTER — Encounter: Payer: Self-pay | Admitting: Obstetrics & Gynecology

## 2013-10-01 VITALS — BP 122/79 | HR 93 | Temp 98.4°F | Wt 128.0 lb

## 2013-10-01 DIAGNOSIS — Z3480 Encounter for supervision of other normal pregnancy, unspecified trimester: Secondary | ICD-10-CM

## 2013-10-01 LAB — POCT URINALYSIS DIPSTICK
BILIRUBIN UA: NEGATIVE
Glucose, UA: NEGATIVE
KETONES UA: NEGATIVE
Leukocytes, UA: NEGATIVE
NITRITE UA: NEGATIVE
PH UA: 6.5
RBC UA: NEGATIVE
Spec Grav, UA: 1.015
Urobilinogen, UA: NEGATIVE

## 2013-10-01 NOTE — Progress Notes (Signed)
Subjective:    [name] is a 28 y.o. female being seen today for her obstetrical visit. She is at 2212w6d gestation. Patient reports: no complaints . Fetal movement: normal.  Problem List Items Addressed This Visit   Supervision of other normal pregnancy - Primary   Relevant Orders      POCT urinalysis dipstick (Completed)     Patient Active Problem List   Diagnosis Date Noted  . Echogenic intracardiac focus of fetus on prenatal ultrasound 08/25/2013    Priority: High  . Unspecified vitamin D deficiency 07/26/2013    Priority: High  . Supervision of other normal pregnancy 07/20/2013    Priority: High  . Vaginitis and vulvovaginitis, unspecified 11/08/2012  . ABDOMINAL CRAMPS 07/02/2010  . CANDIDIASIS 01/01/2010  . VAGINAL BLEEDING 07/30/2009  . FATIGUE 07/30/2009  . CONJUNCTIVAL HEMORRHAGE 04/11/2008  . GONORRHEA 11/15/2007   Objective:    BP 122/79  Pulse 93  Temp(Src) 98.4 F (36.9 C)  Wt 58.06 kg (128 lb)  LMP 03/27/2013 FHT: 150 BPM  Uterine Size: size equals dates     Assessment:    Pregnancy @ 6812w6d    Plan:    Smoking cessation discussed never smoked.  Labs, problem list reviewed and updated 2 hr GTT today Plans Essure Follow up in 4 weeks.

## 2013-10-01 NOTE — Patient Instructions (Signed)
Transcervical Hysteroscopic Sterilization Transcervical hysteroscopic sterilizationis a procedure performed to permanently prevent pregnancy. Transcervical means the procedure is done through the cervix, so no cut (incision) is needed. Tiny coils (microinserts) are placed in the fallopian tubes. After the microinserts are placed, scar tissue forms in the fallopian tubes. The scar tissue will not allow an egg to reach the uterus. If an egg cannot reach the uterus, sperm cannot fertilize it.  It takes at least 3 months after the procedure to determine if the fallopian tubes are blocked. You will need to use another form of birth control for at least 3 months. After that period, you will need to have an X-ray procedure (hysterosalpingography) to confirm that the tubes are blocked.  LET YOUR HEALTH CARE PROVIDER KNOW ABOUT:  Any allergies you have.  All medicines you are taking, including vitamins, herbs, eye drops, creams, and over-the-counter medicines.  Previous problems you or members of your family have had with the use of anesthetics.  Any blood disorders you have.  Previous surgeries you have had.  Medical conditions you have. RISKS AND COMPLICATIONS Generally, this is a safe procedure. However, as with any procedure, complications can occur. Possible complications include:  A hole (perforation) in the uterus or fallopian tube.  Allergic reaction to the coils used to block the fallopian tubes.  The coil falling out (extrusion).  Infection.  Bleeding.  Chronic or acute pelvicpain.  Painful menstrual periods.  A pregnancy that grows inside a fallopian tube instead of the uterus (ectopic pregnancy).  One or both fallopian tubes not becoming fully blocked. BEFORE THE PROCEDURE  You must be very sure that you do not want to get pregnant. Talk about the procedure with your partner.  You may need to have a pregnancy test.  Ask your health care provider about changing or  stopping your regular medicines.  You may need to keep track of your menstrual cycle. This procedure works best when it is done about 7 days after your period starts.  Talk to your health care provider about birth control.  If you are not using birth control, you may need to start 2 3 weeks before the procedure. This makes the procedure easier and ensures that you are not pregnant. PROCEDURE This procedure takes about 30 minutes. You will be awake during the procedure.  You will be lying on your back with your feet in foot rests (stirrups).  A warm metal or plastic instrument (speculum) will be placed in your vagina to keep it open and to allow the health care provider to see the cervix.  You may be given a medicine to numb your cervix (local anesthetic).  A long, thin telescope with a camera (hysteroscope) will be put into your vagina, then through your cervix, and into the uterus. The hysteroscope allows the health care provider to see the openings to both fallopian tubes.  Through the hysteroscope, microinserts are put into the fallopian tube openings. They unwind once they are in place. They do not block the openings to the fallopian tubes, but over time, the microinserts will scar the tubes shut. AFTER THE PROCEDURE  You may be able to go home right away.  You may have mild cramps.  You may have some mild bleeding or discharge from your vagina for a few days.  In 3 months, you will need to have an X-ray (hysterosalpingography) done. This test is done to make sure your fallopian tubes are completely blocked. Until this is confirmed, you will   need to use another form of birth control. Document Released: 02/09/2011 Document Revised: 03/14/2013 Document Reviewed: 12/21/2012 [employer] Patient Information 2014 [employer].  

## 2013-10-02 LAB — CBC
HEMATOCRIT: 30.9 % — AB (ref 36.0–46.0)
HEMOGLOBIN: 10.6 g/dL — AB (ref 12.0–15.0)
MCH: 34.1 pg — ABNORMAL HIGH (ref 26.0–34.0)
MCHC: 34.3 g/dL (ref 30.0–36.0)
MCV: 99.4 fL (ref 78.0–100.0)
Platelets: 197 10*3/uL (ref 150–400)
RBC: 3.11 MIL/uL — ABNORMAL LOW (ref 3.87–5.11)
RDW: 14.4 % (ref 11.5–15.5)
WBC: 6 10*3/uL (ref 4.0–10.5)

## 2013-10-02 LAB — RPR

## 2013-10-02 LAB — GLUCOSE TOLERANCE, 2 HOURS W/ 1HR
GLUCOSE, 2 HOUR: 69 mg/dL — AB (ref 70–139)
Glucose, 1 hour: 64 mg/dL — ABNORMAL LOW (ref 70–170)
Glucose, Fasting: 62 mg/dL — ABNORMAL LOW (ref 70–99)

## 2013-10-02 LAB — HIV ANTIBODY (ROUTINE TESTING W REFLEX): HIV 1&2 Ab, 4th Generation: NONREACTIVE

## 2013-10-13 ENCOUNTER — Encounter (HOSPITAL_COMMUNITY): Payer: Self-pay | Admitting: Family

## 2013-10-13 ENCOUNTER — Inpatient Hospital Stay (HOSPITAL_COMMUNITY)
Admission: AD | Admit: 2013-10-13 | Discharge: 2013-10-13 | Disposition: A | Discharge status: Home / Self Care | Payer: Medicaid Other | Source: Ambulatory Visit | Attending: Obstetrics & Gynecology | Admitting: Obstetrics & Gynecology

## 2013-10-13 DIAGNOSIS — Z3480 Encounter for supervision of other normal pregnancy, unspecified trimester: Secondary | ICD-10-CM

## 2013-10-13 DIAGNOSIS — R197 Diarrhea, unspecified: Secondary | ICD-10-CM | POA: Insufficient documentation

## 2013-10-13 DIAGNOSIS — O99891 Other specified diseases and conditions complicating pregnancy: Secondary | ICD-10-CM | POA: Insufficient documentation

## 2013-10-13 DIAGNOSIS — O289 Unspecified abnormal findings on antenatal screening of mother: Secondary | ICD-10-CM

## 2013-10-13 DIAGNOSIS — E559 Vitamin D deficiency, unspecified: Secondary | ICD-10-CM

## 2013-10-13 DIAGNOSIS — O283 Abnormal ultrasonic finding on antenatal screening of mother: Secondary | ICD-10-CM

## 2013-10-13 DIAGNOSIS — O9989 Other specified diseases and conditions complicating pregnancy, childbirth and the puerperium: Principal | ICD-10-CM

## 2013-10-13 LAB — URINE MICROSCOPIC-ADD ON

## 2013-10-13 LAB — URINALYSIS, ROUTINE W REFLEX MICROSCOPIC
Bilirubin Urine: NEGATIVE
Glucose, UA: NEGATIVE mg/dL
Hgb urine dipstick: NEGATIVE
Ketones, ur: 15 mg/dL — AB
NITRITE: NEGATIVE
PH: 6 (ref 5.0–8.0)
Protein, ur: NEGATIVE mg/dL
Urobilinogen, UA: 0.2 mg/dL (ref 0.0–1.0)

## 2013-10-13 NOTE — MAU Provider Note (Signed)
History     CSN: [id number]  Arrival date and time: 10/13/13 1007   First Provider Initiated Contact with Patient 10/13/13 1115      Chief Complaint  Patient presents with  . Diarrhea   HPI [name] 28 y.o. 1960w4d  Has had diarrhea at home for 4 days.  Is not having abdominal pain.  Vomited several days ago but is having no vomiting today.  Is able to keep down liquids.  Was hungry so she ate chicken wings and then had watery diarrhea again.  Has not taken any medication for diarrhea.  OB History   Grav Para Term Preterm Abortions TAB SAB Ect Mult Living   6 3 3  2     3       Past Medical History  Diagnosis Date  . Anemia     Past Surgical History  Procedure Laterality Date  . Hernia repair      2003  . Vaginal delivery      x 3    Family History  Problem Relation Age of Onset  . Cancer Neg Hx   . Diabetes Neg Hx   . Heart disease Neg Hx   . Hypertension Father   . Hypertension Maternal Grandmother     History  Substance Use Topics  . Smoking status: Never Smoker   . Smokeless tobacco: Never Used  . Alcohol Use: No    Allergies:  Allergies  Allergen Reactions  . Amoxicillin Anaphylaxis and Hives  . Ibuprofen Anaphylaxis and Hives    Prescriptions prior to admission  Medication Sig Dispense Refill  . docusate sodium (COLACE) 100 MG capsule Take 100 mg by mouth daily as needed for mild constipation.       . ferrous sulfate 325 (65 FE) MG tablet Take 325 mg by mouth daily with breakfast.      . Prenatal Vit-Fe Fumarate-FA (PRENATAL MULTIVITAMIN) TABS tablet Take 1 tablet by mouth daily at 12 noon.        Review of Systems  Constitutional: Negative for fever.  Gastrointestinal: Positive for diarrhea. Negative for nausea, vomiting and abdominal pain.  Genitourinary:       No vaginal discharge. No vaginal bleeding. No dysuria.   Physical Exam   Blood pressure 105/70, pulse 95, temperature 98.5 F (36.9 C), temperature source Oral, resp.  rate 16, last menstrual period 03/27/2013.  Physical Exam  Nursing note and vitals reviewed. Constitutional: She is oriented to person, place, and time. She appears well-developed and well-nourished.  HENT:  Head: Normocephalic.  Eyes: EOM are normal.  Neck: Neck supple.  Respiratory: Effort normal.  GI: Soft. There is no tenderness.  Baby's Baseline 140 with moderate variability.  No contractions palpated and none seen on the monitor.  Musculoskeletal: Normal range of motion.  Neurological: She is alert and oriented to person, place, and time.  Skin: Skin is warm and dry.  Psychiatric: She has a normal mood and affect.    MAU Course  Procedures  MDM Results for orders placed during the hospital encounter of 10/13/13 (from the past 24 hour(s))  URINALYSIS, ROUTINE W REFLEX MICROSCOPIC     Status: Abnormal   Collection Time    10/13/13 10:20 AM      Result Value Ref Range   Color, Urine YELLOW  YELLOW   APPearance TURBID (*) CLEAR   Specific Gravity, Urine >1.030 (*) 1.005 - 1.030   pH 6.0  5.0 - 8.0   Glucose, UA NEGATIVE  NEGATIVE  mg/dL   Hgb urine dipstick NEGATIVE  NEGATIVE   Bilirubin Urine NEGATIVE  NEGATIVE   Ketones, ur 15 (*) NEGATIVE mg/dL   Protein, ur NEGATIVE  NEGATIVE mg/dL   Urobilinogen, UA 0.2  0.0 - 1.0 mg/dL   Nitrite NEGATIVE  NEGATIVE   Leukocytes, UA TRACE (*) NEGATIVE  URINE MICROSCOPIC-ADD ON     Status: Abnormal   Collection Time    10/13/13 10:20 AM      Result Value Ref Range   Squamous Epithelial / LPF MANY (*) RARE   WBC, UA 7-10  <3 WBC/hpf   RBC / HPF 0-2  <3 RBC/hpf   Bacteria, UA MANY (*) RARE     Assessment and Plan  Diarrhea in pregnancy  Plan Likely is recovering from GI upset and has advanced her diet too quickly, so diarrhea has continued. Immodium OTC by the package directions. Call the office on Monday if there is no improvement. Drink at least 8 8-oz glasses of water every day. BRAT diet. Return here if your symptoms  worsen before Monday.   [name] [name] 10/13/2013, 1:10 PM

## 2013-10-13 NOTE — Discharge Instructions (Signed)
Immodium OTC by the package directions. Call the office on Monday if there is no improvement. Drink at least 8 8-oz glasses of water every day. BRAT diet. Return here if your symptoms worsen before Monday.

## 2013-10-13 NOTE — MAU Note (Signed)
28 yo, G6P0 at 6952w4d, presents to MAU with diarrhea since Wednesday; vomited 3 times on Wednesday, then symptoms progressed to just diarrhea. No sick contacts, recent abx use, travel, or dietary changes.  Denies VB, LOF. Reports intermittent contractions. Reports +FM.

## 2013-10-31 ENCOUNTER — Ambulatory Visit (INDEPENDENT_AMBULATORY_CARE_PROVIDER_SITE_OTHER): Payer: Medicaid Other | Admitting: Obstetrics & Gynecology

## 2013-10-31 ENCOUNTER — Encounter: Payer: Self-pay | Admitting: Obstetrics & Gynecology

## 2013-10-31 VITALS — BP 116/76 | HR 101 | Temp 99.1°F | Wt 130.0 lb

## 2013-10-31 DIAGNOSIS — Z3480 Encounter for supervision of other normal pregnancy, unspecified trimester: Secondary | ICD-10-CM

## 2013-10-31 LAB — POCT URINALYSIS DIPSTICK
Bilirubin, UA: NEGATIVE
Glucose, UA: NEGATIVE
Ketones, UA: NEGATIVE
Leukocytes, UA: NEGATIVE
Nitrite, UA: NEGATIVE
PROTEIN UA: NEGATIVE
RBC UA: NEGATIVE
SPEC GRAV UA: 1.01
UROBILINOGEN UA: NEGATIVE
pH, UA: 7

## 2013-10-31 NOTE — Patient Instructions (Signed)
Intrauterine Device Information An intrauterine device (IUD) is inserted into your uterus to prevent pregnancy. There are two types of IUDs available:   Copper IUD This type of IUD is wrapped in copper wire and is placed inside the uterus. Copper makes the uterus and fallopian tubes produce a fluid that kills sperm. The copper IUD can stay in place for 10 years.  Hormone IUD This type of IUD contains the hormone progestin (synthetic progesterone). The hormone thickens the cervical mucus and prevents sperm from entering the uterus. It also thins the uterine lining to prevent implantation of a fertilized egg. The hormone can weaken or kill the sperm that get into the uterus. One type of hormone IUD can stay in place for 5 years, and another type can stay in place for 3 years. Your health care provider will make sure you are a good candidate for a contraceptive IUD. Discuss with your health care provider the possible side effects.  ADVANTAGES OF AN INTRAUTERINE DEVICE  IUDs are highly effective, reversible, long acting, and low maintenance.   There are no estrogen-related side effects.   An IUD can be used when breastfeeding.   IUDs are not associated with weight gain.   The copper IUD works immediately after insertion.   The hormone IUD works right away if inserted within 7 days of your period starting. You will need to use a backup method of birth control for 7 days if the hormone IUD is inserted at any other time in your cycle.  The copper IUD does not interfere with your female hormones.   The hormone IUD can make heavy menstrual periods lighter and decrease cramping.   The hormone IUD can be used for 3 or 5 years.   The copper IUD can be used for 10 years. DISADVANTAGES OF AN INTRAUTERINE DEVICE  The hormone IUD can be associated with irregular bleeding patterns.   The copper IUD can make your menstrual flow heavier and more painful.   You may experience cramping and  vaginal bleeding after insertion.  Document Released: 04/27/2004 Document Revised: 01/24/2013 Document Reviewed: 11/12/2012 [employer] Patient Information 2014 [employer].  

## 2013-10-31 NOTE — Progress Notes (Signed)
Subjective:    [name] is a 28 y.o. female being seen today for her obstetrical visit. She is at [redacted]w[redacted]d gestation. Patient reports pelvic pain/difficulty ambulating. Fetal movement: normal.  Problem List Items Addressed This Visit   Supervision of other normal pregnancy - Primary   Relevant Orders      POCT urinalysis dipstick      Urinalysis, Routine w reflex microscopic      Urine culture     Patient Active Problem List   Diagnosis Date Noted  . Echogenic intracardiac focus of fetus on prenatal ultrasound 08/25/2013    Priority: High  . Unspecified vitamin D deficiency 07/26/2013    Priority: High  . Supervision of other normal pregnancy 07/20/2013    Priority: High  . Vaginitis and vulvovaginitis, unspecified 11/08/2012  . ABDOMINAL CRAMPS 07/02/2010  . CANDIDIASIS 01/01/2010  . VAGINAL BLEEDING 07/30/2009  . FATIGUE 07/30/2009  . CONJUNCTIVAL HEMORRHAGE 04/11/2008  . GONORRHEA 11/15/2007   Objective:    BP 116/76  Pulse 101  Temp(Src) 99.1 F (37.3 C)  Wt 58.968 kg (130 lb)  LMP 03/27/2013 FHT:  150 BPM  Uterine Size: size equals dates  Presentation: cephalic     Assessment:    Pregnancy @ [redacted]w[redacted]d weeks   Plan:     labs reviewed, problem list updated TDAP offered  Considering Paragard IUD Orders Placed This Encounter  Procedures  . Urine culture  . Urinalysis, Routine w reflex microscopic  . POCT urinalysis dipstick  Maternity belt/rest Follow up in 2 Weeks.

## 2013-10-31 NOTE — Progress Notes (Signed)
Patient feels a lot of pain and pressure low, pelvic bone soreness - trouble sleeping , frequency and dampness, increase in discharge.

## 2013-11-01 LAB — URINALYSIS, MICROSCOPIC ONLY
BACTERIA UA: NONE SEEN
CASTS: NONE SEEN
CRYSTALS: NONE SEEN

## 2013-11-01 LAB — URINALYSIS, ROUTINE W REFLEX MICROSCOPIC
BILIRUBIN URINE: NEGATIVE
GLUCOSE, UA: NEGATIVE mg/dL
HGB URINE DIPSTICK: NEGATIVE
Ketones, ur: NEGATIVE mg/dL
Nitrite: NEGATIVE
PH: 7 (ref 5.0–8.0)
PROTEIN: NEGATIVE mg/dL
Specific Gravity, Urine: 1.02 (ref 1.005–1.030)
Urobilinogen, UA: 1 mg/dL (ref 0.0–1.0)

## 2013-11-01 LAB — URINE CULTURE
COLONY COUNT: NO GROWTH
Organism ID, Bacteria: NO GROWTH

## 2013-11-14 ENCOUNTER — Ambulatory Visit (INDEPENDENT_AMBULATORY_CARE_PROVIDER_SITE_OTHER): Payer: Medicaid Other | Admitting: Obstetrics & Gynecology

## 2013-11-14 ENCOUNTER — Encounter: Payer: Self-pay | Admitting: Obstetrics & Gynecology

## 2013-11-14 VITALS — BP 112/66 | HR 79 | Temp 98.3°F | Wt 130.0 lb

## 2013-11-14 DIAGNOSIS — Z3480 Encounter for supervision of other normal pregnancy, unspecified trimester: Secondary | ICD-10-CM

## 2013-11-14 LAB — POCT URINALYSIS DIPSTICK
BILIRUBIN UA: NEGATIVE
Glucose, UA: NEGATIVE
KETONES UA: NEGATIVE
NITRITE UA: NEGATIVE
PH UA: 6
Protein, UA: NEGATIVE
RBC UA: NEGATIVE
SPEC GRAV UA: 1.015
Urobilinogen, UA: NEGATIVE

## 2013-11-15 NOTE — Progress Notes (Signed)
Subjective:    [name] is a 28 y.o. female being seen today for her obstetrical visit. She is at [redacted]w[redacted]d gestation. Patient reports no complaints. Fetal movement: normal.  Problem List Items Addressed This Visit   Supervision of other normal pregnancy - Primary   Relevant Orders      POCT urinalysis dipstick (Completed)     Patient Active Problem List   Diagnosis Date Noted  . Echogenic intracardiac focus of fetus on prenatal ultrasound 08/25/2013    Priority: High  . Unspecified vitamin D deficiency 07/26/2013    Priority: High  . Supervision of other normal pregnancy 07/20/2013    Priority: High  . Vaginitis and vulvovaginitis, unspecified 11/08/2012  . ABDOMINAL CRAMPS 07/02/2010  . CANDIDIASIS 01/01/2010  . VAGINAL BLEEDING 07/30/2009  . FATIGUE 07/30/2009  . CONJUNCTIVAL HEMORRHAGE 04/11/2008  . GONORRHEA 11/15/2007   Objective:    BP 112/66  Pulse 79  Temp(Src) 98.3 F (36.8 C)  Wt 58.968 kg (130 lb)  LMP 03/27/2013 FHT:  140 BPM  Uterine Size: size equals dates  Presentation: cephalic     Assessment:    Pregnancy @ [redacted]w[redacted]d weeks   Plan:     labs reviewed, problem list updated  Orders Placed This Encounter  Procedures  . POCT urinalysis dipstick   Follow up in 2 Weeks.

## 2013-11-15 NOTE — Patient Instructions (Signed)
Third Trimester of Pregnancy  The third trimester is from week 29 through week 42, months 7 through 9. The third trimester is a time when the fetus is growing rapidly. At the end of the ninth month, the fetus is about 20 inches in length and weighs 6 10 pounds.   BODY CHANGES  Your body goes through many changes during pregnancy. The changes vary from woman to woman.    Your weight will continue to increase. You can expect to gain 25 35 pounds (11 16 kg) by the end of the pregnancy.   You may begin to get stretch marks on your hips, abdomen, and breasts.   You may urinate more often because the fetus is moving lower into your pelvis and pressing on your bladder.   You may develop or continue to have heartburn as a result of your pregnancy.   You may develop constipation because certain hormones are causing the muscles that push waste through your intestines to slow down.   You may develop hemorrhoids or swollen, bulging veins (varicose veins).   You may have pelvic pain because of the weight gain and pregnancy hormones relaxing your joints between the bones in your pelvis. Back aches may result from over exertion of the muscles supporting your posture.   Your breasts will continue to grow and be tender. A yellow discharge may leak from your breasts called colostrum.   Your belly button may stick out.   You may feel short of breath because of your expanding uterus.   You may notice the fetus "dropping," or moving lower in your abdomen.   You may have a bloody mucus discharge. This usually occurs a few days to a week before labor begins.   Your cervix becomes thin and soft (effaced) near your due date.  WHAT TO EXPECT AT YOUR PRENATAL EXAMS   You will have prenatal exams every 2 weeks until week 36. Then, you will have weekly prenatal exams. During a routine prenatal visit:   You will be weighed to make sure you and the fetus are growing normally.   Your blood pressure is taken.   Your abdomen will be  measured to track your baby's growth.   The fetal heartbeat will be listened to.   Any test results from the previous visit will be discussed.   You may have a cervical check near your due date to see if you have effaced.  At around 36 weeks, your caregiver will check your cervix. At the same time, your caregiver will also perform a test on the secretions of the vaginal tissue. This test is to determine if a type of bacteria, Group B streptococcus, is present. Your caregiver will explain this further.  Your caregiver may ask you:   What your birth plan is.   How you are feeling.   If you are feeling the baby move.   If you have had any abnormal symptoms, such as leaking fluid, bleeding, severe headaches, or abdominal cramping.   If you have any questions.  Other tests or screenings that may be performed during your third trimester include:   Blood tests that check for low iron levels (anemia).   Fetal testing to check the health, activity level, and growth of the fetus. Testing is done if you have certain medical conditions or if there are problems during the pregnancy.  FALSE LABOR  You may feel small, irregular contractions that eventually go away. These are called Braxton Hicks contractions, or   false labor. Contractions may last for hours, days, or even weeks before true labor sets in. If contractions come at regular intervals, intensify, or become painful, it is best to be seen by your caregiver.   SIGNS OF LABOR    Menstrual-like cramps.   Contractions that are 5 minutes apart or less.   Contractions that start on the top of the uterus and spread down to the lower abdomen and back.   A sense of increased pelvic pressure or back pain.   A watery or bloody mucus discharge that comes from the vagina.  If you have any of these signs before the 37th week of pregnancy, call your caregiver right away. You need to go to the hospital to get checked immediately.  HOME CARE INSTRUCTIONS    Avoid all  smoking, herbs, alcohol, and unprescribed drugs. These chemicals affect the formation and growth of the baby.   Follow your caregiver's instructions regarding medicine use. There are medicines that are either safe or unsafe to take during pregnancy.   Exercise only as directed by your caregiver. Experiencing uterine cramps is a good sign to stop exercising.   Continue to eat regular, healthy meals.   Wear a good support bra for breast tenderness.   Do not use hot tubs, steam rooms, or saunas.   Wear your seat belt at all times when driving.   Avoid raw meat, uncooked cheese, cat litter boxes, and soil used by cats. These carry germs that can cause birth defects in the baby.   Take your prenatal vitamins.   Try taking a stool softener (if your caregiver approves) if you develop constipation. Eat more high-fiber foods, such as fresh vegetables or fruit and whole grains. Drink plenty of fluids to keep your urine clear or pale yellow.   Take warm sitz baths to soothe any pain or discomfort caused by hemorrhoids. Use hemorrhoid cream if your caregiver approves.   If you develop varicose veins, wear support hose. Elevate your feet for 15 minutes, 3 4 times a day. Limit salt in your diet.   Avoid heavy lifting, wear low heal shoes, and practice good posture.   Rest a lot with your legs elevated if you have leg cramps or low back pain.   Visit your dentist if you have not gone during your pregnancy. Use a soft toothbrush to brush your teeth and be gentle when you floss.   A sexual relationship may be continued unless your caregiver directs you otherwise.   Do not travel far distances unless it is absolutely necessary and only with the approval of your caregiver.   Take prenatal classes to understand, practice, and ask questions about the labor and delivery.   Make a trial run to the hospital.   Pack your hospital bag.   Prepare the baby's nursery.   Continue to go to all your prenatal visits as directed  by your caregiver.  SEEK MEDICAL CARE IF:   You are unsure if you are in labor or if your water has broken.   You have dizziness.   You have mild pelvic cramps, pelvic pressure, or nagging pain in your abdominal area.   You have persistent nausea, vomiting, or diarrhea.   You have a bad smelling vaginal discharge.   You have pain with urination.  SEEK IMMEDIATE MEDICAL CARE IF:    You have a fever.   You are leaking fluid from your vagina.   You have spotting or bleeding from your vagina.     You have severe abdominal cramping or pain.   You have rapid weight loss or gain.   You have shortness of breath with chest pain.   You notice sudden or extreme swelling of your face, hands, ankles, feet, or legs.   You have not felt your baby move in over an hour.   You have severe headaches that do not go away with medicine.   You have vision changes.  Document Released: 05/18/2001 Document Revised: 01/24/2013 Document Reviewed: 07/25/2012  [employer] Patient Information 2014 [employer].

## 2013-11-28 ENCOUNTER — Encounter: Payer: Medicaid Other | Admitting: Obstetrics & Gynecology

## 2013-11-29 ENCOUNTER — Encounter: Payer: Self-pay | Admitting: Obstetrics & Gynecology

## 2013-11-29 ENCOUNTER — Ambulatory Visit (INDEPENDENT_AMBULATORY_CARE_PROVIDER_SITE_OTHER): Payer: Medicaid Other | Admitting: Obstetrics & Gynecology

## 2013-11-29 VITALS — Temp 98.6°F | Wt 132.0 lb

## 2013-11-29 DIAGNOSIS — Z3480 Encounter for supervision of other normal pregnancy, unspecified trimester: Secondary | ICD-10-CM

## 2013-11-29 DIAGNOSIS — Z3483 Encounter for supervision of other normal pregnancy, third trimester: Secondary | ICD-10-CM

## 2013-11-30 LAB — STREP B DNA PROBE: GBSP: NOT DETECTED

## 2013-12-02 NOTE — Patient Instructions (Signed)
Patient information: Group B streptococcus and pregnancy (Beyond the Basics)  Authors Karen M Puopolo, MD, PhD Carol J Baker, MD Section Editors Charles J Lockwood, MD Daniel J Sexton, MD Deputy Editor Vanessa A Barss, MD Disclosures  All topics are updated as new evidence becomes available and our peer review process is complete.  Literature review current through: Feb 2014.  This topic last updated: Dec 06, 2011.  INTRODUCTION - Group B streptococcus (GBS) is a bacterium that can cause serious infections in pregnant women and newborn babies. GBS is one of many types of streptococcal bacteria, sometimes called "strep." This article discusses GBS, its effect on pregnant women and infants, and ways to prevent complications of GBS. More detailed information about GBS is available by subscription. (See "Group B streptococcal infection in pregnant women".) WHAT IS GROUP B STREP INFECTION? - GBS is commonly found in the digestive system and the vagina. In healthy adults, GBS is not harmful and does not cause problems. But in pregnant women and newborn infants, being infected with GBS can cause serious illness. Approximately one in three to four pregnant women in the [place] carries GBS in their gastrointestinal system and/or in their vagina. Carrying GBS is not the same as being infected. Carriers are not sick and do not need treatment during pregnancy. There is no treatment that can stop you from carrying GBS.  Pregnant women who are carriers of GBS infrequently become infected with GBS. GBS can cause urinary tract infections, infection of the amniotic fluid (bag of water), and infection of the uterus after delivery. GBS infections during pregnancy may lead to preterm labor.  Pregnant women who carry GBS can pass on the bacteria to their newborns, and some of those babies become infected with GBS. Newborns who are infected with GBS can develop pneumonia (lung infection), septicemia (blood infection), or  meningitis (infection of the lining of the brain and spinal cord). These complications can be prevented by giving intravenous antibiotics during labor to any woman who is at risk of GBS infection. You are at risk of GBS infection if: You have a urine culture during your current pregnancy showing GBS  You have a vaginal and rectal culture during your current pregnancy showing GBS  You had an infant infected with GBS in the past GROUP B STREP PREVENTION - Most doctors and nurses recommend a urine culture early in your pregnancy to be sure that you do not have a bladder infection without symptoms. If you urine culture shows GBS or other bacteria, you may be treated with an antibiotic. If you have symptoms of urinary infection, such as pain with urination, any time during your pregnancy, a urine culture is done. If GBS grows from the urine culture, it should be treated with an antibiotic, and you should also receive intravenous antibiotics during labor. Expert groups recommend that all pregnant women have a GBS culture at 35 to 37 weeks of pregnancy. The culture is done by swabbing the vagina and rectum. If your GBS culture is positive, you will be given an intravenous antibiotic during labor. If you have preterm labor, the culture is done then and an intravenous antibiotic is given until the baby is born or the labor is stopped by your health care Gerrett Loman. If you have a positive GBS culture and you have an allergy to penicillin, be sure your doctor and nurse are aware of this allergy and tell them what happened with the allergy. If you had only a rash or itching, this   is not a serious allergy, and you can receive a common drug related to the penicillin. If you had a serious allergy (for example, trouble breathing, swelling of your face) you may need an additional test to determine which antibiotic should be used during labor. Being treated with an antibiotic during labor greatly reduces the chance that you or  your newborn will develop infections related to GBS. It is important to note that young infants up to age 3 months can also develop septicemia, meningitis and other serious infections from GBS. Being treated with an antibiotic during labor does not reduce the chance that your baby will develop this later type of infection. There is currently no known way of preventing this later-onset GBS disease. WHERE TO GET MORE INFORMATION - Your healthcare Jeannene Tschetter is the best source of information for questions and concerns related to your medical problem.  

## 2013-12-02 NOTE — Progress Notes (Signed)
Subjective:    [name] is a 28 y.o. female being seen today for her obstetrical visit. She is at 2024w2d gestation. Patient reports no complaints. Fetal movement: normal.  Problem List Items Addressed This Visit   Supervision of other normal pregnancy - Primary   Relevant Orders      POCT urinalysis dipstick      Strep B DNA probe (Completed)     Patient Active Problem List   Diagnosis Date Noted  . Echogenic intracardiac focus of fetus on prenatal ultrasound 08/25/2013    Priority: High  . Unspecified vitamin D deficiency 07/26/2013    Priority: High  . Supervision of other normal pregnancy 07/20/2013    Priority: High  . Vaginitis and vulvovaginitis, unspecified 11/08/2012  . ABDOMINAL CRAMPS 07/02/2010  . CANDIDIASIS 01/01/2010  . VAGINAL BLEEDING 07/30/2009  . FATIGUE 07/30/2009  . CONJUNCTIVAL HEMORRHAGE 04/11/2008  . GONORRHEA 11/15/2007   Objective:    Temp(Src) 98.6 F (37 C)  Wt 59.875 kg (132 lb)  LMP 03/27/2013 FHT:  140 BPM  Uterine Size: size equals dates  Presentation: cephalic     Assessment:    Pregnancy @ 3724w2d weeks   Plan:     labs reviewed, problem list updated  Orders Placed This Encounter  Procedures  . Strep B DNA probe  . POCT urinalysis dipstick   Follow up in 2 Weeks.

## 2013-12-04 LAB — POCT URINALYSIS DIPSTICK
BILIRUBIN UA: NEGATIVE
GLUCOSE UA: NEGATIVE
KETONES UA: NEGATIVE
Leukocytes, UA: NEGATIVE
Nitrite, UA: NEGATIVE
SPEC GRAV UA: 1.015
Urobilinogen, UA: NEGATIVE
pH, UA: 6.5

## 2013-12-13 ENCOUNTER — Ambulatory Visit (INDEPENDENT_AMBULATORY_CARE_PROVIDER_SITE_OTHER): Payer: Medicaid Other | Admitting: Obstetrics & Gynecology

## 2013-12-13 ENCOUNTER — Encounter: Payer: Self-pay | Admitting: Obstetrics & Gynecology

## 2013-12-13 VITALS — BP 106/72 | HR 78 | Temp 98.6°F | Wt 134.0 lb

## 2013-12-13 DIAGNOSIS — Z3480 Encounter for supervision of other normal pregnancy, unspecified trimester: Secondary | ICD-10-CM

## 2013-12-13 DIAGNOSIS — F3289 Other specified depressive episodes: Secondary | ICD-10-CM

## 2013-12-13 DIAGNOSIS — O99340 Other mental disorders complicating pregnancy, unspecified trimester: Secondary | ICD-10-CM

## 2013-12-13 DIAGNOSIS — O99343 Other mental disorders complicating pregnancy, third trimester: Secondary | ICD-10-CM

## 2013-12-13 DIAGNOSIS — Z3483 Encounter for supervision of other normal pregnancy, third trimester: Secondary | ICD-10-CM

## 2013-12-13 DIAGNOSIS — F329 Major depressive disorder, single episode, unspecified: Secondary | ICD-10-CM

## 2013-12-14 NOTE — Patient Instructions (Signed)
Stress Stress-related medical problems are becoming increasingly common. The body has a built-in physical response to stressful situations. Faced with pressure, challenge or danger, we need to react quickly. Our bodies release hormones such as cortisol and adrenaline to help do this. These hormones are part of the "fight or flight" response and affect the metabolic rate, heart rate and blood pressure, resulting in a heightened, stressed state that prepares the body for optimum performance in dealing with a stressful situation. It is likely that early man required these mechanisms to stay alive, but usually modern stresses do not call for this, and the same hormones released in today's world can damage health and reduce coping ability. CAUSES  Pressure to perform at work, at school or in sports.  Threats of physical violence.  Money worries.  Arguments.  Family conflicts.  Divorce or separation from significant other.  Bereavement.  New job or unemployment.  Changes in location.  Alcohol or drug abuse. SOMETIMES, THERE IS NO PARTICULAR REASON FOR DEVELOPING STRESS. Almost all people are at risk of being stressed at some time in their lives. It is important to know that some stress is temporary and some is long term.  Temporary stress will go away when a situation is resolved. Most people can cope with short periods of stress, and it can often be relieved by relaxing, taking a walk or getting any type of exercise, chatting through issues with friends, or having a good night's sleep.  Chronic (long-term, continuous) stress is much harder to deal with. It can be psychologically and emotionally damaging. It can be harmful both for an individual and for friends and family. SYMPTOMS Everyone reacts to stress differently. There are some common effects that help us recognize it. In times of extreme stress, people may:  Shake uncontrollably.  Breathe faster and deeper than normal  (hyperventilate).  Vomit.  For people with asthma, stress can trigger an attack.  For some people, stress may trigger migraine headaches, ulcers, and body pain. PHYSICAL EFFECTS OF STRESS MAY INCLUDE:  Loss of energy.  Skin problems.  Aches and pains resulting from tense muscles, including neck ache, backache and tension headaches.  Increased pain from arthritis and other conditions.  Irregular heart beat (palpitations).  Periods of irritability or anger.  Apathy or depression.  Anxiety (feeling uptight or worrying).  Unusual behavior.  Loss of appetite.  Comfort eating.  Lack of concentration.  Loss of, or decreased, sex-drive.  Increased smoking, drinking, or recreational drug use.  For women, missed periods.  Ulcers, joint pain, and muscle pain. Post-traumatic stress is the stress caused by any serious accident, strong emotional damage, or extremely difficult or violent experience such as rape or war. Post-traumatic stress victims can experience mixtures of emotions such as fear, shame, depression, guilt or anger. It may include recurrent memories or images that may be haunting. These feelings can last for weeks, months or even years after the traumatic event that triggered them. Specialized treatment, possibly with medicines and psychological therapies, is available. If stress is causing physical symptoms, severe distress or making it difficult for you to function as normal, it is worth seeing your caregiver. It is important to remember that although stress is a usual part of life, extreme or prolonged stress can lead to other illnesses that will need treatment. It is better to visit a doctor sooner rather than later. Stress has been linked to the development of high blood pressure and heart disease, as well as insomnia and depression.   There is no diagnostic test for stress since everyone reacts to it differently. But a caregiver will be able to spot the physical  symptoms, such as:  Headaches.  Shingles.  Ulcers. Emotional distress such as intense worry, low mood or irritability should be detected when the doctor asks pertinent questions to identify any underlying problems that might be the cause. In case there are physical reasons for the symptoms, the doctor may also want to do some tests to exclude certain conditions. If you feel that you are suffering from stress, try to identify the aspects of your life that are causing it. Sometimes you may not be able to change or avoid them, but even a small change can have a positive ripple effect. A simple lifestyle change can make all the difference. STRATEGIES THAT CAN HELP DEAL WITH STRESS:  Delegating or sharing responsibilities.  Avoiding confrontations.  Learning to be more assertive.  Regular exercise.  Avoid using alcohol or street drugs to cope.  Eating a healthy, balanced diet, rich in fruit and vegetables and proteins.  Finding humor or absurdity in stressful situations.  Never taking on more than you know you can handle comfortably.  Organizing your time better to get as much done as possible.  Talking to friends or family and sharing your thoughts and fears.  Listening to music or relaxation tapes.  Relaxation techniques like deep breathing, meditation, and yoga.  Tensing and then relaxing your muscles, starting at the toes and working up to the head and neck. If you think that you would benefit from help, either in identifying the things that are causing your stress or in learning techniques to help you relax, see a caregiver who is capable of helping you with this. Rather than relying on medications, it is usually better to try and identify the things in your life that are causing stress and try to deal with them. There are many techniques of managing stress including counseling, psychotherapy, aromatherapy, yoga, and exercise. Your caregiver can help you determine what is best  for you. Document Released: 08/14/2002 Document Revised: 05/29/2013 Document Reviewed: 07/11/2007 [employer] Patient Information 2015 [employer]. This information is not intended to replace advice given to you by your health care provider. Make sure you discuss any questions you have with your health care provider.  

## 2013-12-14 NOTE — Progress Notes (Signed)
Subjective:    [name] is a 28 y.o. female being seen today for her obstetrical visit. She is at 4538w2d gestation. Patient reports stress, symptoms of depression. Fetal movement: normal.  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 Echogenic intracardiac focus of fetus on prenatal ultrasound 08/25/2013    Priority: High  . Unspecified vitamin D deficiency 07/26/2013    Priority: High  . Supervision of other normal pregnancy 07/20/2013    Priority: High  . Vaginitis and vulvovaginitis, unspecified 11/08/2012  . ABDOMINAL CRAMPS 07/02/2010  . CANDIDIASIS 01/01/2010  . VAGINAL BLEEDING 07/30/2009  . FATIGUE 07/30/2009  . CONJUNCTIVAL HEMORRHAGE 04/11/2008  . GONORRHEA 11/15/2007    Objective:    BP 106/72  Pulse 78  Temp(Src) 98.6 F (37 C)  Wt 60.782 kg (134 lb)  LMP 03/27/2013 FHT: 140 BPM  Uterine Size: size equals dates  Presentations: cephalic     Assessment:    Pregnancy @ 5338w2d weeks   Plan:   Plans for delivery: Vaginal anticipated; labs reviewed; problem list updated L&D discussion: symptoms of labor, discussed when to call, discussed what number to call, anesthetic/analgesic options reviewed. Postpartum supports and preparation: contraception plans discussed. Referral for counseling  Follow up in 1 Week.

## 2013-12-17 LAB — POCT URINALYSIS DIPSTICK
Bilirubin, UA: NEGATIVE
Blood, UA: NEGATIVE
Glucose, UA: NEGATIVE
Ketones, UA: NEGATIVE
Leukocytes, UA: NEGATIVE
Nitrite, UA: NEGATIVE
PROTEIN UA: NEGATIVE
Spec Grav, UA: <=1.005
Urobilinogen, UA: NEGATIVE
pH, UA: 8

## 2013-12-20 ENCOUNTER — Ambulatory Visit (INDEPENDENT_AMBULATORY_CARE_PROVIDER_SITE_OTHER): Payer: Medicaid Other | Admitting: Obstetrics & Gynecology

## 2013-12-20 ENCOUNTER — Encounter: Payer: Self-pay | Admitting: Obstetrics & Gynecology

## 2013-12-20 VITALS — BP 121/85 | HR 77 | Temp 97.4°F | Wt 134.0 lb

## 2013-12-20 DIAGNOSIS — Z3483 Encounter for supervision of other normal pregnancy, third trimester: Secondary | ICD-10-CM

## 2013-12-20 DIAGNOSIS — Z3480 Encounter for supervision of other normal pregnancy, unspecified trimester: Secondary | ICD-10-CM

## 2013-12-20 LAB — POCT URINALYSIS DIPSTICK
Blood, UA: NEGATIVE
Glucose, UA: NEGATIVE
KETONES UA: NEGATIVE
Nitrite, UA: NEGATIVE
PH UA: 6
Spec Grav, UA: <=1.005

## 2013-12-20 NOTE — Patient Instructions (Signed)
Third Trimester of Pregnancy The third trimester is from week 29 through week 42, months 7 through 9. The third trimester is a time when the fetus is growing rapidly. At the end of the ninth month, the fetus is about 20 inches in length and weighs 6-10 pounds.  BODY CHANGES Your body goes through many changes during pregnancy. The changes vary from woman to woman.   Your weight will continue to increase. You can expect to gain 25-35 pounds (11-16 kg) by the end of the pregnancy.  You may begin to get stretch marks on your hips, abdomen, and breasts.  You may urinate more often because the fetus is moving lower into your pelvis and pressing on your bladder.  You may develop or continue to have heartburn as a result of your pregnancy.  You may develop constipation because certain hormones are causing the muscles that push waste through your intestines to slow down.  You may develop hemorrhoids or swollen, bulging veins (varicose veins).  You may have pelvic pain because of the weight gain and pregnancy hormones relaxing your joints between the bones in your pelvis. Backaches may result from overexertion of the muscles supporting your posture.  You may have changes in your hair. These can include thickening of your hair, rapid growth, and changes in texture. Some women also have hair loss during or after pregnancy, or hair that feels dry or thin. Your hair will most likely return to normal after your baby is born.  Your breasts will continue to grow and be tender. A yellow discharge may leak from your breasts called colostrum.  Your belly button may stick out.  You may feel short of breath because of your expanding uterus.  You may notice the fetus "dropping," or moving lower in your abdomen.  You may have a bloody mucus discharge. This usually occurs a few days to a week before labor begins.  Your cervix becomes thin and soft (effaced) near your due date. WHAT TO EXPECT AT YOUR PRENATAL  EXAMS  You will have prenatal exams every 2 weeks until week 36. Then, you will have weekly prenatal exams. During a routine prenatal visit:  You will be weighed to make sure you and the fetus are growing normally.  Your blood pressure is taken.  Your abdomen will be measured to track your baby's growth.  The fetal heartbeat will be listened to.  Any test results from the previous visit will be discussed.  You may have a cervical check near your due date to see if you have effaced. At around 36 weeks, your caregiver will check your cervix. At the same time, your caregiver will also perform a test on the secretions of the vaginal tissue. This test is to determine if a type of bacteria, Group B streptococcus, is present. Your caregiver will explain this further. Your caregiver may ask you:  What your birth plan is.  How you are feeling.  If you are feeling the baby move.  If you have had any abnormal symptoms, such as leaking fluid, bleeding, severe headaches, or abdominal cramping.  If you have any questions. Other tests or screenings that may be performed during your third trimester include:  Blood tests that check for low iron levels (anemia).  Fetal testing to check the health, activity level, and growth of the fetus. Testing is done if you have certain medical conditions or if there are problems during the pregnancy. FALSE LABOR You may feel small, irregular contractions that   eventually go away. These are called Braxton Hicks contractions, or false labor. Contractions may last for hours, days, or even weeks before true labor sets in. If contractions come at regular intervals, intensify, or become painful, it is best to be seen by your caregiver.  SIGNS OF LABOR   Menstrual-like cramps.  Contractions that are 5 minutes apart or less.  Contractions that start on the top of the uterus and spread down to the lower abdomen and back.  A sense of increased pelvic pressure or back  pain.  A watery or bloody mucus discharge that comes from the vagina. If you have any of these signs before the 37th week of pregnancy, call your caregiver right away. You need to go to the hospital to get checked immediately. HOME CARE INSTRUCTIONS   Avoid all smoking, herbs, alcohol, and unprescribed drugs. These chemicals affect the formation and growth of the baby.  Follow your caregiver's instructions regarding medicine use. There are medicines that are either safe or unsafe to take during pregnancy.  Exercise only as directed by your caregiver. Experiencing uterine cramps is a good sign to stop exercising.  Continue to eat regular, healthy meals.  Wear a good support bra for breast tenderness.  Do not use hot tubs, steam rooms, or saunas.  Wear your seat belt at all times when driving.  Avoid raw meat, uncooked cheese, cat litter boxes, and soil used by cats. These carry germs that can cause birth defects in the baby.  Take your prenatal vitamins.  Try taking a stool softener (if your caregiver approves) if you develop constipation. Eat more high-fiber foods, such as fresh vegetables or fruit and whole grains. Drink plenty of fluids to keep your urine clear or pale yellow.  Take warm sitz baths to soothe any pain or discomfort caused by hemorrhoids. Use hemorrhoid cream if your caregiver approves.  If you develop varicose veins, wear support hose. Elevate your feet for 15 minutes, 3-4 times a day. Limit salt in your diet.  Avoid heavy lifting, wear low heal shoes, and practice good posture.  Rest a lot with your legs elevated if you have leg cramps or low back pain.  Visit your dentist if you have not gone during your pregnancy. Use a soft toothbrush to brush your teeth and be gentle when you floss.  A sexual relationship may be continued unless your caregiver directs you otherwise.  Do not travel far distances unless it is absolutely necessary and only with the approval  of your caregiver.  Take prenatal classes to understand, practice, and ask questions about the labor and delivery.  Make a trial run to the hospital.  Pack your hospital bag.  Prepare the baby's nursery.  Continue to go to all your prenatal visits as directed by your caregiver. SEEK MEDICAL CARE IF:  You are unsure if you are in labor or if your water has broken.  You have dizziness.  You have mild pelvic cramps, pelvic pressure, or nagging pain in your abdominal area.  You have persistent nausea, vomiting, or diarrhea.  You have a bad smelling vaginal discharge.  You have pain with urination. SEEK IMMEDIATE MEDICAL CARE IF:   You have a fever.  You are leaking fluid from your vagina.  You have spotting or bleeding from your vagina.  You have severe abdominal cramping or pain.  You have rapid weight loss or gain.  You have shortness of breath with chest pain.  You notice sudden or extreme swelling   of your face, hands, ankles, feet, or legs.  You have not felt your baby move in over an hour.  You have severe headaches that do not go away with medicine.  You have vision changes. Document Released: 05/18/2001 Document Revised: 05/29/2013 Document Reviewed: 07/25/2012 ExitCare Patient Information 2015 [employer]. This information is not intended to replace advice given to you by your health care provider. Make sure you discuss any questions you have with your health care provider.  

## 2013-12-20 NOTE — Progress Notes (Signed)
Subjective:    [name] is a 28 y.o. female being seen today for her obstetrical visit. She is at 2874w2d gestation. Patient reports no complaints. Fetal movement: normal.  Problem List Items Addressed This Visit   Supervision of other normal pregnancy - Primary   Relevant Orders      POCT urinalysis dipstick     Patient Active Problem List   Diagnosis Date Noted  . Echogenic intracardiac focus of fetus on prenatal ultrasound 08/25/2013    Priority: High  . Unspecified vitamin D deficiency 07/26/2013    Priority: High  . Supervision of other normal pregnancy 07/20/2013    Priority: High  . Vaginitis and vulvovaginitis, unspecified 11/08/2012  . ABDOMINAL CRAMPS 07/02/2010  . CANDIDIASIS 01/01/2010  . VAGINAL BLEEDING 07/30/2009  . FATIGUE 07/30/2009  . CONJUNCTIVAL HEMORRHAGE 04/11/2008  . GONORRHEA 11/15/2007    Objective:    BP 121/85  Pulse 77  Temp(Src) 97.4 F (36.3 C)  Wt 60.782 kg (134 lb)  LMP 03/27/2013 FHT: 140 BPM  Uterine Size: size equals dates  Presentations: cephalic    EFW 3000 - 3400g Assessment:    Pregnancy @ 2774w2d weeks   Plan:    Follow up in 1 Week.

## 2013-12-21 ENCOUNTER — Inpatient Hospital Stay (HOSPITAL_COMMUNITY)
Admission: AD | Admit: 2013-12-21 | Discharge: 2013-12-23 | DRG: 775 | Disposition: A | Discharge status: Home / Self Care | Payer: Medicaid Other | Source: Ambulatory Visit | Attending: Obstetrics | Admitting: Obstetrics

## 2013-12-21 ENCOUNTER — Encounter (HOSPITAL_COMMUNITY): Payer: Self-pay | Admitting: *Deleted

## 2013-12-21 DIAGNOSIS — IMO0001 Reserved for inherently not codable concepts without codable children: Secondary | ICD-10-CM

## 2013-12-21 DIAGNOSIS — O479 False labor, unspecified: Secondary | ICD-10-CM | POA: Diagnosis present

## 2013-12-21 DIAGNOSIS — D649 Anemia, unspecified: Secondary | ICD-10-CM | POA: Diagnosis present

## 2013-12-21 DIAGNOSIS — Z8249 Family history of ischemic heart disease and other diseases of the circulatory system: Secondary | ICD-10-CM

## 2013-12-21 DIAGNOSIS — O9902 Anemia complicating childbirth: Secondary | ICD-10-CM | POA: Diagnosis present

## 2013-12-21 LAB — CBC
HCT: 34.5 % — ABNORMAL LOW (ref 36.0–46.0)
Hemoglobin: 12 g/dL (ref 12.0–15.0)
MCH: 34.4 pg — AB (ref 26.0–34.0)
MCHC: 34.8 g/dL (ref 30.0–36.0)
MCV: 98.9 fL (ref 78.0–100.0)
PLATELETS: 196 10*3/uL (ref 150–400)
RBC: 3.49 MIL/uL — ABNORMAL LOW (ref 3.87–5.11)
RDW: 13.3 % (ref 11.5–15.5)
WBC: 8.2 10*3/uL (ref 4.0–10.5)

## 2013-12-21 MED ORDER — PROMETHAZINE HCL 25 MG/ML IJ SOLN: 25.0000 mg | Freq: Four times a day (QID) | INTRAMUSCULAR | Status: DC | PRN

## 2013-12-21 MED ORDER — FLEET ENEMA 7-19 GM/118ML RE ENEM: 1.0000 | ENEMA | RECTAL | Status: DC | PRN

## 2013-12-21 MED ORDER — LACTATED RINGERS IV SOLN
INTRAVENOUS | Status: DC
  Administered 2013-12-21 – 2013-12-22 (×2): via INTRAVENOUS

## 2013-12-21 MED ORDER — IBUPROFEN 600 MG PO TABS: 600.0000 mg | ORAL_TABLET | Freq: Four times a day (QID) | ORAL | Status: DC | PRN

## 2013-12-21 MED ORDER — CITRIC ACID-SODIUM CITRATE 334-500 MG/5ML PO SOLN: 30.0000 mL | ORAL | Status: DC | PRN

## 2013-12-21 MED ORDER — OXYTOCIN BOLUS FROM INFUSION: 500.0000 mL | INTRAVENOUS | Status: DC

## 2013-12-21 MED ORDER — NALBUPHINE HCL 10 MG/ML IJ SOLN
10.0000 mg | INTRAMUSCULAR | Status: DC | PRN
  Administered 2013-12-22: 10 mg via INTRAVENOUS
  Filled 2013-12-21: qty 1

## 2013-12-21 MED ORDER — LACTATED RINGERS IV SOLN: 500.0000 mL | INTRAVENOUS | Status: DC | PRN

## 2013-12-21 MED ORDER — OXYCODONE-ACETAMINOPHEN 5-325 MG PO TABS: 1.0000 | ORAL_TABLET | ORAL | Status: DC | PRN

## 2013-12-21 MED ORDER — NALBUPHINE HCL 10 MG/ML IJ SOLN: 10.0000 mg | Freq: Four times a day (QID) | INTRAMUSCULAR | Status: DC | PRN

## 2013-12-21 MED ORDER — LIDOCAINE HCL (PF) 1 % IJ SOLN
30.0000 mL | INTRAMUSCULAR | Status: DC | PRN
  Filled 2013-12-21: qty 30

## 2013-12-21 MED ORDER — ACETAMINOPHEN 325 MG PO TABS: 650.0000 mg | ORAL_TABLET | ORAL | Status: DC | PRN

## 2013-12-21 MED ORDER — ONDANSETRON HCL 4 MG/2ML IJ SOLN: 4.0000 mg | Freq: Four times a day (QID) | INTRAMUSCULAR | Status: DC | PRN

## 2013-12-21 MED ORDER — OXYTOCIN 40 UNITS IN LACTATED RINGERS INFUSION - SIMPLE MED
62.5000 mL/h | INTRAVENOUS | Status: DC
  Filled 2013-12-21: qty 1000

## 2013-12-21 NOTE — MAU Note (Signed)
Contractions since 1200. No bleeding or leaking fld

## 2013-12-21 NOTE — H&P (Signed)
[name] is a 28 y.o. female presenting for UC's. Maternal Medical History:  Reason for admission: Contractions.   Contractions: Onset was 6-12 hours ago.    Fetal activity: Perceived fetal activity is normal.   Last perceived fetal movement was within the past hour.    Prenatal complications: no prenatal complications Prenatal Complications - Diabetes: none.    OB History   Grav Para Term Preterm Abortions TAB SAB Ect Mult Living   6 3 3  2     3      Past Medical History  Diagnosis Date  . Anemia    Past Surgical History  Procedure Laterality Date  . Hernia repair      2003  . Vaginal delivery      x 3   Family History: family history includes Hypertension in her father and maternal grandmother. There is no history of Cancer, Diabetes, or Heart disease. Social History:  reports that she has never smoked. She has never used smokeless tobacco. She reports that she does not drink alcohol or use illicit drugs.   Prenatal Transfer Tool  Maternal Diabetes: No Genetic Screening: Normal Maternal Ultrasounds/Referrals: Normal Fetal Ultrasounds or other Referrals:  None Maternal Substance Abuse:  No Significant Maternal Medications:  None Significant Maternal Lab Results:  None Other Comments:  None  Review of Systems  All other systems reviewed and are negative.   Dilation: 3 Effacement (%): 80 Station: -2 Exam by:: K. WEiss RN Blood pressure 110/75, pulse 90, temperature 98.3 F (36.8 C), resp. rate 20, height 5\' 2"  (1.575 m), weight 132 lb 9.6 oz (60.147 kg), last menstrual period 03/27/2013. Maternal Exam:  Uterine Assessment: Contraction strength is moderate.  Abdomen: Patient reports no abdominal tenderness. Pelvis: adequate for delivery.   Cervix: Cervix evaluated by digital exam.     Physical Exam  Nursing note and vitals reviewed. Constitutional: She is oriented to person, place, and time. She appears well-developed and well-nourished.  HENT:   Head: Normocephalic and atraumatic.  Eyes: Conjunctivae are normal. Pupils are equal, round, and reactive to light.  Neck: Normal range of motion. Neck supple.  Cardiovascular: Normal rate and regular rhythm.   Respiratory: Effort normal.  GI: Soft.  Genitourinary: Vagina normal and uterus normal.  Musculoskeletal: Normal range of motion.  Neurological: She is alert and oriented to person, place, and time.  Skin: Skin is warm and dry.  Psychiatric: She has a normal mood and affect. Her behavior is normal. Judgment and thought content normal.    Prenatal labs: ABO, Rh: O/POS/-- (02/13 1605) Antibody: NEG (02/13 1605) Rubella: 1.47 (02/13 1605) RPR: NON REAC (04/27 1416)  HBsAg: NEGATIVE (02/13 1605)  HIV: NONREACTIVE (04/27 1416)  GBS: NOT DETECTED (06/25 1754)   Assessment/Plan: 38 weeks.  UC's.  Admit for probable early labor.   Carmelle Bamberg A 12/21/2013, 11:20 PM

## 2013-12-22 ENCOUNTER — Encounter (HOSPITAL_COMMUNITY): Payer: Self-pay | Admitting: *Deleted

## 2013-12-22 ENCOUNTER — Inpatient Hospital Stay (HOSPITAL_COMMUNITY): Payer: Medicaid Other | Admitting: Anesthesiology

## 2013-12-22 ENCOUNTER — Encounter (HOSPITAL_COMMUNITY): Payer: Medicaid Other | Admitting: Anesthesiology

## 2013-12-22 LAB — RPR

## 2013-12-22 MED ORDER — EPHEDRINE 5 MG/ML INJ
10.0000 mg | INTRAVENOUS | Status: DC | PRN
  Filled 2013-12-22: qty 2

## 2013-12-22 MED ORDER — MEDROXYPROGESTERONE ACETATE 150 MG/ML IM SUSP: 150.0000 mg | INTRAMUSCULAR | Status: DC | PRN

## 2013-12-22 MED ORDER — ONDANSETRON HCL 4 MG/2ML IJ SOLN: 4.0000 mg | INTRAMUSCULAR | Status: DC | PRN

## 2013-12-22 MED ORDER — SENNOSIDES-DOCUSATE SODIUM 8.6-50 MG PO TABS
2.0000 | ORAL_TABLET | ORAL | Status: DC
  Administered 2013-12-22: 2 via ORAL
  Filled 2013-12-22: qty 2

## 2013-12-22 MED ORDER — FENTANYL 2.5 MCG/ML BUPIVACAINE 1/10 % EPIDURAL INFUSION (WH - ANES)
INTRAMUSCULAR | Status: DC | PRN
  Administered 2013-12-22: 14 mL/h via EPIDURAL

## 2013-12-22 MED ORDER — LACTATED RINGERS IV SOLN
500.0000 mL | Freq: Once | INTRAVENOUS | Status: AC
  Administered 2013-12-22: 500 mL via INTRAVENOUS

## 2013-12-22 MED ORDER — IBUPROFEN 600 MG PO TABS: 600.0000 mg | ORAL_TABLET | Freq: Four times a day (QID) | ORAL | Status: DC

## 2013-12-22 MED ORDER — TETANUS-DIPHTH-ACELL PERTUSSIS 5-2.5-18.5 LF-MCG/0.5 IM SUSP: 0.5000 mL | Freq: Once | INTRAMUSCULAR | Status: DC

## 2013-12-22 MED ORDER — LIDOCAINE HCL (PF) 1 % IJ SOLN
INTRAMUSCULAR | Status: DC | PRN
  Administered 2013-12-22 (×2): 4 mL

## 2013-12-22 MED ORDER — ZOLPIDEM TARTRATE 5 MG PO TABS: 5.0000 mg | ORAL_TABLET | Freq: Every evening | ORAL | Status: DC | PRN

## 2013-12-22 MED ORDER — PRENATAL MULTIVITAMIN CH
1.0000 | ORAL_TABLET | Freq: Every day | ORAL | Status: DC
  Administered 2013-12-22: 1 via ORAL
  Filled 2013-12-22: qty 1

## 2013-12-22 MED ORDER — SIMETHICONE 80 MG PO CHEW: 80.0000 mg | CHEWABLE_TABLET | ORAL | Status: DC | PRN

## 2013-12-22 MED ORDER — WITCH HAZEL-GLYCERIN EX PADS: 1.0000 "application " | MEDICATED_PAD | CUTANEOUS | Status: DC | PRN

## 2013-12-22 MED ORDER — OXYTOCIN 40 UNITS IN LACTATED RINGERS INFUSION - SIMPLE MED: 62.5000 mL/h | INTRAVENOUS | Status: DC | PRN

## 2013-12-22 MED ORDER — PHENYLEPHRINE 40 MCG/ML (10ML) SYRINGE FOR IV PUSH (FOR BLOOD PRESSURE SUPPORT)
80.0000 ug | PREFILLED_SYRINGE | INTRAVENOUS | Status: DC | PRN
  Administered 2013-12-22: 80 ug via INTRAVENOUS
  Filled 2013-12-22: qty 2

## 2013-12-22 MED ORDER — PHENYLEPHRINE 40 MCG/ML (10ML) SYRINGE FOR IV PUSH (FOR BLOOD PRESSURE SUPPORT)
80.0000 ug | PREFILLED_SYRINGE | INTRAVENOUS | Status: DC | PRN
  Filled 2013-12-22: qty 10
  Filled 2013-12-22: qty 2

## 2013-12-22 MED ORDER — FENTANYL 2.5 MCG/ML BUPIVACAINE 1/10 % EPIDURAL INFUSION (WH - ANES): 14.0000 mL/h | INTRAMUSCULAR | Status: DC | PRN

## 2013-12-22 MED ORDER — LANOLIN HYDROUS EX OINT: TOPICAL_OINTMENT | CUTANEOUS | Status: DC | PRN

## 2013-12-22 MED ORDER — DIPHENHYDRAMINE HCL 50 MG/ML IJ SOLN: 12.5000 mg | INTRAMUSCULAR | Status: DC | PRN

## 2013-12-22 MED ORDER — OXYCODONE-ACETAMINOPHEN 5-325 MG PO TABS
1.0000 | ORAL_TABLET | ORAL | Status: DC | PRN
  Administered 2013-12-22 (×2): 1 via ORAL
  Administered 2013-12-22 – 2013-12-23 (×2): 2 via ORAL
  Filled 2013-12-22: qty 1
  Filled 2013-12-22 (×2): qty 2
  Filled 2013-12-22: qty 1

## 2013-12-22 MED ORDER — DIPHENHYDRAMINE HCL 25 MG PO CAPS
25.0000 mg | ORAL_CAPSULE | Freq: Four times a day (QID) | ORAL | Status: DC | PRN
Start: 2013-12-22 — End: 2013-12-23

## 2013-12-22 MED ORDER — ONDANSETRON HCL 4 MG PO TABS: 4.0000 mg | ORAL_TABLET | ORAL | Status: DC | PRN

## 2013-12-22 MED ORDER — FENTANYL 2.5 MCG/ML BUPIVACAINE 1/10 % EPIDURAL INFUSION (WH - ANES)
14.0000 mL/h | INTRAMUSCULAR | Status: DC | PRN
  Administered 2013-12-22: 14 mL/h via EPIDURAL
  Filled 2013-12-22: qty 125

## 2013-12-22 MED ORDER — DIBUCAINE 1 % RE OINT: 1.0000 "application " | TOPICAL_OINTMENT | RECTAL | Status: DC | PRN

## 2013-12-22 MED ORDER — BENZOCAINE-MENTHOL 20-0.5 % EX AERO
1.0000 "application " | INHALATION_SPRAY | CUTANEOUS | Status: DC | PRN
  Administered 2013-12-22: 1 via TOPICAL
  Filled 2013-12-22: qty 56

## 2013-12-22 NOTE — Anesthesia Procedure Notes (Signed)
Epidural Patient location during procedure: OB  Staffing Anesthesiologist: Joshaua Epple R Performed by: anesthesiologist   Preanesthetic Checklist Completed: patient identified, pre-op evaluation, timeout performed, IV checked, risks and benefits discussed and monitors and equipment checked  Epidural Patient position: sitting Prep: site prepped and draped and DuraPrep Patient monitoring: heart rate Approach: midline Location: L3-L4 Injection technique: LOR air and LOR saline  Needle:  Needle type: Tuohy  Needle gauge: 17 G Needle length: 9 cm Needle insertion depth: 4 cm Catheter type: closed end flexible Catheter size: 19 Gauge Catheter at skin depth: 10 cm Test dose: negative  Assessment Sensory level: T8 Events: blood not aspirated, injection not painful, no injection resistance, negative IV test and no paresthesia  Additional Notes Reason for block:procedure for pain   

## 2013-12-22 NOTE — Progress Notes (Signed)
Blanca FriendDemekka Q Camba is a 28 y.o. [id number] at 3449w4d by LMP admitted for active labor  Subjective:   Objective: BP 102/59  Pulse 118  Temp(Src) 97.7 F (36.5 C) (Oral)  Resp 18  Ht 5\' 2"  (1.575 m)  Wt 133 lb (60.328 kg)  BMI 24.32 kg/m2  LMP 03/27/2013      FHT:  120-130 bpm UC:   regular, every 2-3 minutes SVE:   Dilation: 8 Effacement (%): 100 Station: 0 Exam by:: [email or web address]  Labs: Lab Results  Component Value Date   WBC 8.2 12/21/2013   HGB 12.0 12/21/2013   HCT 34.5* 12/21/2013   MCV 98.9 12/21/2013   PLT 196 12/21/2013    Assessment / Plan: Spontaneous labor, progressing normally  Labor: Progressing normally Preeclampsia:  n/a Fetal Wellbeing:  Category I Pain Control:  Epidural I/D:  n/a Anticipated MOD:  NSVD  Elliett Guarisco A 12/22/2013, 1:51 AM

## 2013-12-22 NOTE — Anesthesia Preprocedure Evaluation (Signed)
Anesthesia Evaluation  Patient identified by MRN, date of birth, ID band Patient awake    Reviewed: Allergy & Precautions, H&P , NPO status , Patient's Chart, lab work & pertinent test results  Airway Mallampati: II TM Distance: >3 FB Neck ROM: Full    Dental  (+) Dental Advisory Given   Pulmonary neg pulmonary ROS,  breath sounds clear to auscultation        Cardiovascular negative cardio ROS  Rhythm:Regular Rate:Normal     Neuro/Psych negative neurological ROS  negative psych ROS   GI/Hepatic negative GI ROS, Neg liver ROS,   Endo/Other  negative endocrine ROS  Renal/GU negative Renal ROS     Musculoskeletal negative musculoskeletal ROS (+)   Abdominal   Peds  Hematology  (+) Blood dyscrasia, anemia ,   Anesthesia Other Findings   Reproductive/Obstetrics negative OB ROS                           Anesthesia Physical Anesthesia Plan  ASA: II  Anesthesia Plan: Epidural   Post-op Pain Management:    Induction:   Airway Management Planned:   Additional Equipment:   Intra-op Plan:   Post-operative Plan:   Informed Consent: I have reviewed the patients History and Physical, chart, labs and discussed the procedure including the risks, benefits and alternatives for the proposed anesthesia with the patient or authorized representative who has indicated his/her understanding and acceptance.     Plan Discussed with:   Anesthesia Plan Comments:         Anesthesia Quick Evaluation

## 2013-12-22 NOTE — Lactation Note (Signed)
This note was copied from the chart of [name]. Lactation Consultation Note  P4,Mayra Neer Breastfed second child for approx 2 day but was readmitted for retained placenta and stopped breastfeeding.  Did not breastfeed other children. Reviewed hand expression with teachback and mother was able to express drops of colostrum. Reviewed how to latch in football position, basics. Mom encouraged to feed baby 8-12 times/24 hours and with feeding cues. Mom made aware of O/P services, breastfeeding support groups, community resources, and our phone # for post-discharge questions.  Provided mother with a hand pump.   Patient Name: [name] WUJWJ'XToday's Date: 12/22/2013 Reason for consult: Initial assessment   Maternal Data Formula Feeding for Exclusion: No Has patient been taught Hand Expression?: Yes  Feeding Feeding Type: Breast Fed Length of feed: 20 min  LATCH Score/Interventions                      Lactation Tools Discussed/Used     Consult Status Consult Status: Follow-up Date: 12/23/13 Follow-up type: In-patient    Dahlia ByesBerkelhammer, Ruth [facility] 12/22/2013, 12:52 PM

## 2013-12-22 NOTE — Anesthesia Postprocedure Evaluation (Signed)
Anesthesia Post Note  Patient: [name]  Procedure(s) Performed: * No procedures listed *  Anesthesia type: Epidural  Patient location: Mother/Baby  Post pain: Pain level controlled  Post assessment: Post-op Vital signs reviewed  Last Vitals:  Filed Vitals:   12/22/13 0745  BP: 113/78  Pulse: 97  Temp: 37 C  Resp: 18    Post vital signs: Reviewed  Level of consciousness:alert  Complications: No apparent anesthesia complications

## 2013-12-23 ENCOUNTER — Ambulatory Visit: Payer: Self-pay

## 2013-12-23 LAB — CBC
HCT: 28.2 % — ABNORMAL LOW (ref 36.0–46.0)
HEMOGLOBIN: 9.5 g/dL — AB (ref 12.0–15.0)
MCH: 33.5 pg (ref 26.0–34.0)
MCHC: 33.7 g/dL (ref 30.0–36.0)
MCV: 99.3 fL (ref 78.0–100.0)
Platelets: 163 10*3/uL (ref 150–400)
RBC: 2.84 MIL/uL — ABNORMAL LOW (ref 3.87–5.11)
RDW: 13.2 % (ref 11.5–15.5)
WBC: 6.8 10*3/uL (ref 4.0–10.5)

## 2013-12-23 MED ORDER — OXYCODONE-ACETAMINOPHEN 5-325 MG PO TABS: 1.0000 | ORAL_TABLET | ORAL | Status: DC | PRN

## 2013-12-23 NOTE — Progress Notes (Signed)
Post Partum Day 1 Subjective: no complaints  Objective: Blood pressure 101/70, pulse 77, temperature 97.7 F (36.5 C), temperature source Oral, resp. rate 18, height 5\' 2"  (1.575 m), weight 133 lb (60.328 kg), last menstrual period 03/27/2013, unknown if currently breastfeeding.  Physical Exam:  General: alert and no distress Lochia: appropriate Uterine Fundus: firm Incision: none DVT Evaluation: No evidence of DVT seen on physical exam.   Recent Labs  12/21/13 2325 12/23/13 0615  HGB 12.0 9.5*  HCT 34.5* 28.2*    Assessment/Plan: Discharge home   LOS: 2 days   HARPER,CHARLES A 12/23/2013, 7:30 AM

## 2013-12-23 NOTE — Discharge Instructions (Signed)
Before Baby Comes Home °Ask any questions about feeding, diapering, and baby care before you leave the hospital. Ask again if you do not understand. Ask when you need to see the doctor again. °There are several things you must have before your baby comes home. °· Infant car seat. °· Crib. °¨ Do not let your baby sleep in a bed with you or anyone else. °¨ If you do not have a bed for your baby, ask the doctor what you can use that will be safe for the baby to sleep in. °Infant feeding supplies: °· 6 to 8 bottles (8 oz. size). °· 6 to 8 nipples. °· Measuring cup. °· Measuring tablespoon. °· Bottle brush. °· Sterilizer (or use any large pan or kettle with a lid). °· Formula that contains iron. °· A way to boil and cool water. °Breastfeeding supplies: °· Breast pump. °· Nipple cream. °Clothing: °· 24 to 36 cloth diapers and waterproof diaper covers or a box of disposable diapers. You may need as many as 10 to 12 diapers per day. °· 3 onesies (other clothing will depend on the time of year and the weather). °· 3 receiving blankets. °· 3 baby pajamas or gowns. °· 3 bibs. °Bath equipment: °· Mild soap. °· Petroleum jelly. No baby oil or powder. °· Soft cloth towel and wash cloth. °· Cotton balls. °· Separate bath basin for baby. Only sponge bathe until umbilical cord and circumcision are healed. °Other supplies: °· Thermometer and bulb syringe (ask the hospital to send them home with you). Ask your doctor about how you should take your baby's temperature. °· One to two pacifiers. °Prepare for an emergency: °· Know how to get to the hospital and know where to admit your baby. °· Put all doctor numbers near your house phone and in your cell phone if you have one. °Prepare your family: °· Talk with siblings about the baby coming home and how they feel about it. °· Decide how you want to handle visitors and other family members. °· Take offers for help with the baby. You will need time to adjust. °Know when to call the doctor.   °GET HELP RIGHT AWAY IF: °· Your baby's temperature is greater than 100.4° F (38° C). °· The softspot on your baby's head starts to bulge. °· Your baby is crying with no tears or has no wet diapers for 6 hours. °· Your baby has rapid breathing. °· Your baby is not as alert. °Document Released: 05/06/2008 Document Revised: 08/16/2011 Document Reviewed: 08/13/2010 °Exit[employer]® Patient Information ©2015 [employer]. This information is not intended to replace advice given to you by your health care provider. Make sure you discuss any questions you have with your health care provider. ° °

## 2013-12-23 NOTE — Lactation Note (Addendum)
This note was copied from the chart of [name]. Lactation Consultation Note  Mother states breastfeeding improving and feels good about it.   Reviewed breast massage and engorgement care and monitoring voids/stools. Mom encouraged to feed baby 8-12 times/24 hours and with feeding cues.  Encouraged mother to call if she needs further assistance.   Patient Name: [name] WUJWJ'XToday's Date: 12/23/2013 Reason for consult: Follow-up assessment   Maternal Data    Feeding Feeding Type: Breast Fed Length of feed: 35 min  LATCH Score/Interventions Latch: Grasps breast easily, tongue down, lips flanged, rhythmical sucking.  Audible Swallowing: A few with stimulation  Type of Nipple: Everted at rest and after stimulation  Comfort (Breast/Nipple): Soft / non-tender     Hold (Positioning): No assistance needed to correctly position infant at breast.  LATCH Score: 9  Lactation Tools Discussed/Used     Consult Status Consult Status: Complete    Hardie PulleyBerkelhammer, Ruth Boschen 12/23/2013, 12:34 PM

## 2013-12-23 NOTE — Discharge Summary (Signed)
Obstetric Discharge Summary Reason for Admission: onset of labor Prenatal Procedures: ultrasound Intrapartum Procedures: spontaneous vaginal delivery Postpartum Procedures: none Complications-Operative and Postpartum: none Hemoglobin  Date Value Ref Range Status  12/23/2013 9.5* 12.0 - 15.0 g/dL Final     REPEATED TO VERIFY     DELTA CHECK NOTED     HCT  Date Value Ref Range Status  12/23/2013 28.2* 36.0 - 46.0 % Final    Physical Exam:  General: alert and no distress Lochia: appropriate Uterine Fundus: firm Incision: none DVT Evaluation: No evidence of DVT seen on physical exam.  Discharge Diagnoses: Term Pregnancy-delivered  Discharge Information: Date: 12/23/2013 Activity: pelvic rest Diet: routine Medications: PNV, Colace and Percocet Condition: stable Instructions: refer to practice specific booklet Discharge to: home Follow-up Information   Follow up with Antionette CharJACKSON-MOORE,LISA A, MD In 6 weeks.   Specialty:  Obstetrics and Gynecology   Contact information:   [address] [place] [id number] [phone]       Newborn Data: Live born female  Birth Weight: 7 lb 0.4 oz (3187 g) APGAR: 9, 9  Home with mother.  [name],[name] 12/23/2013, 7:36 AM

## 2013-12-24 NOTE — Progress Notes (Signed)
Post discharge chart review completed.  

## 2013-12-27 ENCOUNTER — Ambulatory Visit: Payer: Medicaid Other | Admitting: Obstetrics

## 2013-12-27 ENCOUNTER — Encounter: Payer: Medicaid Other | Admitting: Obstetrics

## 2014-02-06 ENCOUNTER — Ambulatory Visit (INDEPENDENT_AMBULATORY_CARE_PROVIDER_SITE_OTHER): Payer: Medicaid Other | Admitting: Obstetrics & Gynecology

## 2014-02-06 DIAGNOSIS — Z30011 Encounter for initial prescription of contraceptive pills: Secondary | ICD-10-CM

## 2014-02-06 DIAGNOSIS — Z3009 Encounter for other general counseling and advice on contraception: Secondary | ICD-10-CM

## 2014-02-06 MED ORDER — NORETHINDRONE 0.35 MG PO TABS: 1.0000 | ORAL_TABLET | Freq: Every day | ORAL | Status: DC

## 2014-02-06 NOTE — Progress Notes (Signed)
Patient ID: [name], female   DOB: September 28, 1985, 28 y.o.   MRN: [id number] Subjective:     [name] is a 28 y.o. female who presents for a postpartum visit. She is 6 weeks postpartum following a spontaneous vaginal delivery. I have fully reviewed the prenatal and intrapartum course. The delivery was at 38 gestational weeks. Outcome: spontaneous vaginal delivery. Anesthesia: epidural. Postpartum course has been uncomplicated. Baby's course has been unremarkable. Baby is feeding by breast. Bleeding no bleeding. Bowel function is abnormal: constipation. Bladder function is normal. Patient is not sexually active. Contraception method is abstinence.  Tobacco, alcohol and substance abuse history reviewed.  Adult immunizations reviewed including TDAP, rubella and varicella.  The following portions of the patient's history were reviewed and updated as appropriate: allergies, current medications, past family history, past medical history, past social history, past surgical history and problem list.  Review of Systems Pertinent items are noted in HPI.   Objective:    BP 136/86  Pulse 60  Temp(Src) 98.5 F (36.9 C)  Wt 53.071 kg (117 lb)        General:   alert  Skin:   no rash or abnormalities  Lungs:   clear to auscultation bilaterally  Heart:   regular rate and rhythm, S1, S2 normal, no murmur, click, rub or gallop  Breasts:   normal without suspicious masses, skin or nipple changes or axillary nodes  Abdomen:  normal findings: no organomegaly, soft, non-tender and no hernia  Pelvis:  External genitalia: normal general appearance Urinary system: urethral meatus normal and bladder without fullness, nontender Vaginal: thin, white vaginal discharge Cervix: normal appearance Adnexa: normal bimanual exam Uterus: anteverted and non-tender, normal size    .  Assessment:   Bacterial vaginosis likely.  Plan:     Contraception: plans minipill Follow up as needed.  Meds ordered  this encounter  Medications  . norethindrone (MICRONOR,CAMILA,ERRIN) 0.35 MG tablet    Sig: Take 1 tablet (0.35 mg total) by mouth daily.    Dispense:  1 Package    Refill:  11   Preconception counseling provided Healthy lifestyle practices reviewed

## 2014-02-06 NOTE — Patient Instructions (Signed)

## 2014-02-07 LAB — WET PREP BY MOLECULAR PROBE
Candida species: NEGATIVE
GARDNERELLA VAGINALIS: POSITIVE — AB
TRICHOMONAS VAG: NEGATIVE

## 2014-02-08 ENCOUNTER — Encounter: Payer: Self-pay | Admitting: Obstetrics & Gynecology

## 2014-02-14 ENCOUNTER — Other Ambulatory Visit: Payer: Self-pay | Admitting: *Deleted

## 2014-02-14 DIAGNOSIS — N760 Acute vaginitis: Principal | ICD-10-CM

## 2014-02-14 DIAGNOSIS — B9689 Other specified bacterial agents as the cause of diseases classified elsewhere: Secondary | ICD-10-CM

## 2014-02-14 MED ORDER — METRONIDAZOLE 0.75 % VA GEL: 1.0000 | Freq: Every day | VAGINAL | Status: DC

## 2014-04-08 ENCOUNTER — Encounter: Payer: Self-pay | Admitting: Obstetrics & Gynecology

## 2014-06-03 ENCOUNTER — Encounter: Payer: Self-pay | Admitting: *Deleted

## 2014-06-04 ENCOUNTER — Encounter: Payer: Self-pay | Admitting: Obstetrics & Gynecology

## 2014-09-28 ENCOUNTER — Encounter (HOSPITAL_COMMUNITY): Payer: Self-pay | Admitting: *Deleted

## 2014-09-28 ENCOUNTER — Emergency Department (HOSPITAL_COMMUNITY)
Admission: EM | Admit: 2014-09-28 | Discharge: 2014-09-28 | Disposition: A | Discharge status: Home / Self Care | Payer: Medicaid Other | Source: Home / Self Care | Attending: Family Medicine | Admitting: Family Medicine

## 2014-09-28 DIAGNOSIS — Z3490 Encounter for supervision of normal pregnancy, unspecified, unspecified trimester: Secondary | ICD-10-CM

## 2014-09-28 DIAGNOSIS — R1110 Vomiting, unspecified: Secondary | ICD-10-CM | POA: Diagnosis not present

## 2014-09-28 DIAGNOSIS — Z3201 Encounter for pregnancy test, result positive: Secondary | ICD-10-CM

## 2014-09-28 LAB — POCT URINALYSIS DIP (DEVICE)
Bilirubin Urine: NEGATIVE
Glucose, UA: NEGATIVE mg/dL
HGB URINE DIPSTICK: NEGATIVE
Ketones, ur: NEGATIVE mg/dL
Nitrite: NEGATIVE
Protein, ur: NEGATIVE mg/dL
Specific Gravity, Urine: 1.025 (ref 1.005–1.030)
UROBILINOGEN UA: 0.2 mg/dL (ref 0.0–1.0)
pH: 7 (ref 5.0–8.0)

## 2014-09-28 LAB — POCT PREGNANCY, URINE: PREG TEST UR: POSITIVE — AB

## 2014-09-28 MED ORDER — ONDANSETRON 4 MG PO TBDP: 4.0000 mg | ORAL_TABLET | Freq: Three times a day (TID) | ORAL | Status: DC | PRN

## 2014-09-28 MED ORDER — ONDANSETRON 4 MG PO TBDP: 4.0000 mg | ORAL_TABLET | Freq: Once | ORAL | Status: DC

## 2014-09-28 NOTE — Discharge Instructions (Signed)
First Trimester of Pregnancy The first trimester of pregnancy is from week 1 until the end of week 12 (months 1 through 3). During this time, your baby will begin to develop inside you. At 6-8 weeks, the eyes and face are formed, and the heartbeat can be seen on ultrasound. At the end of 12 weeks, all the baby's organs are formed. Prenatal care is all the medical care you receive before the birth of your baby. Make sure you get good prenatal care and follow all of your doctor's instructions. HOME CARE  Medicines  Take medicine only as told by your doctor. Some medicines are safe and some are not during pregnancy.  Take your prenatal vitamins as told by your doctor.  Take medicine that helps you poop (stool softener) as needed if your doctor says it is okay. Diet  Eat regular, healthy meals.  Your doctor will tell you the amount of weight gain that is right for you.  Avoid raw meat and uncooked cheese.  If you feel sick to your stomach (nauseous) or throw up (vomit):  Eat 4 or 5 small meals a day instead of 3 large meals.  Try eating a few soda crackers.  Drink liquids between meals instead of during meals.  If you have a hard time pooping (constipation):  Eat high-fiber foods like fresh vegetables, fruit, and whole grains.  Drink enough fluids to keep your pee (urine) clear or pale yellow. Activity and Exercise  Exercise only as told by your doctor. Stop exercising if you have cramps or pain in your lower belly (abdomen) or low back.  Try to avoid standing for long periods of time. Move your legs often if you must stand in one place for a long time.  Avoid heavy lifting.  Wear low-heeled shoes. Sit and stand up straight.  You can have sex unless your doctor tells you not to. Relief of Pain or Discomfort  Wear a good support bra if your breasts are sore.  Take warm water baths (sitz baths) to soothe pain or discomfort caused by hemorrhoids. Use hemorrhoid cream if your  doctor says it is okay.  Rest with your legs raised if you have leg cramps or low back pain.  Wear support hose if you have puffy, bulging veins (varicose veins) in your legs. Raise (elevate) your feet for 15 minutes, 3-4 times a day. Limit salt in your diet. Prenatal Care  Schedule your prenatal visits by the twelfth week of pregnancy.  Write down your questions. Take them to your prenatal visits.  Keep all your prenatal visits as told by your doctor. Safety  Wear your seat belt at all times when driving.  Make a list of emergency phone numbers. The list should include numbers for family, friends, the hospital, and police and fire departments. General Tips  Ask your doctor for a referral to a local prenatal class. Begin classes no later than at the start of month 6 of your pregnancy.  Ask for help if you need counseling or help with nutrition. Your doctor can give you advice or tell you where to go for help.  Do not use hot tubs, steam rooms, or saunas.  Do not douche or use tampons or scented sanitary pads.  Do not cross your legs for long periods of time.  Avoid litter boxes and soil used by cats.  Avoid all smoking, herbs, and alcohol. Avoid drugs not approved by your doctor.  Visit your dentist. At home, brush your teeth  with a soft toothbrush. Be gentle when you floss. GET HELP IF:  You are dizzy.  You have mild cramps or pressure in your lower belly.  You have a nagging pain in your belly area.  You continue to feel sick to your stomach, throw up, or have watery poop (diarrhea).  You have a bad smelling fluid coming from your vagina.  You have pain with peeing (urination).  You have increased puffiness (swelling) in your face, hands, legs, or ankles. GET HELP RIGHT AWAY IF:   You have a fever.  You are leaking fluid from your vagina.  You have spotting or bleeding from your vagina.  You have very bad belly cramping or pain.  You gain or lose weight  rapidly.  You throw up blood. It may look like coffee grounds.  You are around people who have [place] measles, fifth disease, or chickenpox.  You have a very bad headache.  You have shortness of breath.  You have any kind of trauma, such as from a fall or a car accident. Document Released: 11/10/2007 Document Revised: 10/08/2013 Document Reviewed: 04/03/2013 [facility] Patient Information 2015 [place], [place]. This information is not intended to replace advice given to you by your health care provider. Make sure you discuss any questions you have with your health care provider.     May use Zofran as needed only for Nausea. Vitamin B6 complex may help this nausea as well, to take 1 a day. Start Pre-natal vitamins daily with folic acid; OTC. Please schedule with OBGYN to get established.

## 2014-09-28 NOTE — ED Notes (Signed)
Pt  Reports      Vomiting    Body  Aches            Symptoms  X    1  Day

## 2014-09-28 NOTE — ED Provider Notes (Signed)
CSN: [id number]     Arrival date & time 09/28/14  0901 History   First MD Initiated Contact with Patient 09/28/14 [id number]     Chief Complaint  Patient presents with  . Emesis   (Consider location/radiation/quality/duration/timing/severity/associated sxs/prior Treatment) HPI Comments: [name] presents with <24 hour history of nausea, emesis and overall malaise. No fever, though chills. No abdominal pain or urinary symptoms. No sore throat. No known exposures.   Patient is a 29 y.o. female presenting with vomiting. The history is provided by the patient.  Emesis Associated symptoms: chills   Associated symptoms: no abdominal pain     Past Medical History  Diagnosis Date  . Anemia    Past Surgical History  Procedure Laterality Date  . Hernia repair      2003  . Vaginal delivery      x 3   Family History  Problem Relation Age of Onset  . Cancer Neg Hx   . Diabetes Neg Hx   . Heart disease Neg Hx   . Hypertension Father   . Hypertension Maternal Grandmother    History  Substance Use Topics  . Smoking status: Never Smoker   . Smokeless tobacco: Never Used  . Alcohol Use: No   OB History    Gravida Para Term Preterm AB TAB SAB Ectopic Multiple Living   6 4 4  2     4      Review of Systems  Constitutional: Positive for chills and fatigue. Negative for fever.  HENT: Negative.   Respiratory: Negative.   Gastrointestinal: Positive for nausea and vomiting. Negative for abdominal pain.  Genitourinary: Negative for dysuria, flank pain and difficulty urinating.  Skin: Negative.   Psychiatric/Behavioral: Negative.     Allergies  Amoxicillin and Ibuprofen  Home Medications   Prior to Admission medications   Medication Sig Start Date End Date Taking? Authorizing Provider  docusate sodium (COLACE) 100 MG capsule Take 100 mg by mouth daily as needed for mild constipation.     Historical Provider, MD  metroNIDAZOLE (METROGEL VAGINAL) 0.75 % vaginal gel Place 1  Applicatorful vaginally at bedtime. 02/14/14   Natalie CharLisa Jackson-Moore, MD  norethindrone (MICRONOR,CAMILA,ERRIN) 0.35 MG tablet Take 1 tablet (0.35 mg total) by mouth daily. 02/06/14   Natalie CharLisa Jackson-Moore, MD  ondansetron (ZOFRAN ODT) 4 MG disintegrating tablet Take 1 tablet (4 mg total) by mouth every 8 (eight) hours as needed for nausea or vomiting. 09/28/14   Natalie SheerMichelle G Gorge Almanza, PA-C  oxyCODONE-acetaminophen (PERCOCET/ROXICET) 5-325 MG per tablet Take 1-2 tablets by mouth every 4 (four) hours as needed for severe pain (moderate - severe pain). 12/23/13   Brock Badharles A Harper, MD   BP 113/73 mmHg  Pulse 99  Temp(Src) 99 F (37.2 C) (Oral)  Resp 12  SpO2 100%  LMP 08/15/2014 (Exact Date) Physical Exam  Constitutional: She is oriented to person, place, and time. She appears well-developed and well-nourished. No distress.  Non-toxic  HENT:  Head: Normocephalic and atraumatic.  Mouth/Throat: Oropharynx is clear and moist.  Neck: Normal range of motion.  Cardiovascular: Normal rate and regular rhythm.   Pulmonary/Chest: Effort normal and breath sounds normal.  Abdominal: Soft. Bowel sounds are normal. She exhibits no distension. There is no tenderness. There is no rebound and no guarding.  Musculoskeletal: Normal range of motion.  Lymphadenopathy:    She has no cervical adenopathy.  Neurological: She is alert and oriented to person, place, and time.  Skin: Skin is warm and dry. She is not diaphoretic.  Psychiatric: Her behavior is normal.  Nursing note and vitals reviewed.   ED Course  Procedures (including critical care time) Labs Review Labs Reviewed  POCT URINALYSIS DIP (DEVICE) - Abnormal; Notable for the following:    Leukocytes, UA TRACE (*)    All other components within normal limits  POCT PREGNANCY, URINE - Abnormal; Notable for the following:    Preg Test, Ur POSITIVE (*)    All other components within normal limits    Imaging Review No results found.   MDM   1. Pregnancy    2. Intractable vomiting with nausea, vomiting of unspecified type    2nd pregnancy. Treat symptoms with Zofran  as needed. May start taking vitamin B6 for nausea/Vomiting. Start Prenatal vitamins daily (writtent RX given). Establish care with OB/GYN as previous.     Natalie Sheer, PA-C 09/28/14 1017

## 2014-12-16 LAB — PROCEDURE REPORT - SCANNED: Pap: NEGATIVE

## 2015-03-18 ENCOUNTER — Encounter (HOSPITAL_COMMUNITY): Payer: Self-pay | Admitting: *Deleted

## 2015-03-18 ENCOUNTER — Inpatient Hospital Stay (HOSPITAL_COMMUNITY)
Admission: AD | Admit: 2015-03-18 | Discharge: 2015-03-18 | Disposition: A | Discharge status: Home / Self Care | Payer: Medicaid Other | Source: Ambulatory Visit | Attending: Obstetrics | Admitting: Obstetrics

## 2015-03-18 DIAGNOSIS — N390 Urinary tract infection, site not specified: Secondary | ICD-10-CM

## 2015-03-18 DIAGNOSIS — Z3A32 32 weeks gestation of pregnancy: Secondary | ICD-10-CM | POA: Diagnosis not present

## 2015-03-18 DIAGNOSIS — O26893 Other specified pregnancy related conditions, third trimester: Secondary | ICD-10-CM | POA: Insufficient documentation

## 2015-03-18 DIAGNOSIS — O4703 False labor before 37 completed weeks of gestation, third trimester: Secondary | ICD-10-CM | POA: Diagnosis not present

## 2015-03-18 DIAGNOSIS — R110 Nausea: Secondary | ICD-10-CM | POA: Insufficient documentation

## 2015-03-18 LAB — URINALYSIS, ROUTINE W REFLEX MICROSCOPIC
BILIRUBIN URINE: NEGATIVE
GLUCOSE, UA: NEGATIVE mg/dL
KETONES UR: NEGATIVE mg/dL
Nitrite: NEGATIVE
PROTEIN: NEGATIVE mg/dL
Specific Gravity, Urine: 1.025 (ref 1.005–1.030)
Urobilinogen, UA: 1 mg/dL (ref 0.0–1.0)
pH: 6 (ref 5.0–8.0)

## 2015-03-18 LAB — URINE MICROSCOPIC-ADD ON

## 2015-03-18 LAB — FETAL FIBRONECTIN: FETAL FIBRONECTIN: NEGATIVE

## 2015-03-18 MED ORDER — PROMETHAZINE HCL 25 MG PO TABS: 25.0000 mg | ORAL_TABLET | Freq: Four times a day (QID) | ORAL | Status: DC | PRN

## 2015-03-18 MED ORDER — PROMETHAZINE HCL 25 MG PO TABS
25.0000 mg | ORAL_TABLET | Freq: Once | ORAL | Status: AC
  Administered 2015-03-18: 25 mg via ORAL
  Filled 2015-03-18: qty 1

## 2015-03-18 MED ORDER — NITROFURANTOIN MONOHYD MACRO 100 MG PO CAPS: 100.0000 mg | ORAL_CAPSULE | Freq: Two times a day (BID) | ORAL | Status: DC

## 2015-03-18 MED ORDER — TERBUTALINE SULFATE 1 MG/ML IJ SOLN
0.2500 mg | Freq: Once | INTRAMUSCULAR | Status: AC
  Administered 2015-03-18: 0.25 mg via SUBCUTANEOUS
  Filled 2015-03-18: qty 1

## 2015-03-18 NOTE — MAU Provider Note (Signed)
History     CSN: [id number]  Arrival date and time: 03/18/15 1123   First Provider Initiated Contact with Patient 03/18/15 1216      Chief Complaint  Patient presents with  . Nausea  . Contractions  . Vaginal Discharge  . Generalized Body Aches   This is a 29 y.o. female at [redacted]w[redacted]d who presents with c/o leaking fluid and pelvic pressure since yesterday.  Had several "gushes".  No constant leaking. Denies bleeding. Has also had nausea since yesterday.    Vaginal Discharge The patient's primary symptoms include vaginal discharge. The patient's pertinent negatives include no genital itching, genital lesions or genital rash. This is a new problem. The current episode started yesterday. The problem occurs intermittently. The problem has been unchanged. The patient is experiencing no pain (but has pressure). She is pregnant. Associated symptoms include nausea. Pertinent negatives include no back pain, constipation, diarrhea, dysuria, fever, frequency or vomiting. The vaginal discharge was clear. There has been no bleeding. She has not been passing clots. She has not been passing tissue.  Emesis  This is a new problem. The current episode started yesterday. The problem occurs intermittently. The problem has been unchanged. Emesis appearance: NAUSEA ONLY , NO EMESIS. There has been no fever. Pertinent negatives include no diarrhea or fever. She has tried nothing for the symptoms.   RN note: Pt states that she is feeling a lot of pressure. "like the baby is going to fall out" Pt reports gush of fluid x3 two days ago and then another gush of fluid yesterday. Pt has had no leaking today. + FM. Denies bleeding. Pt reports generalized achyness all over and nausea. Pt would like some medication for nausea.   OB History    Gravida Para Term Preterm AB TAB SAB Ectopic Multiple Living   Past Medical History  Diagnosis Date  . Anemia     Past Surgical History  Procedure  Laterality Date  . Hernia repair      2003  . Vaginal delivery      x 3    Family History  Problem Relation Age of Onset  . Cancer Neg Hx   . Diabetes Neg Hx   . Heart disease Neg Hx   . Hypertension Father   . Hypertension Maternal Grandmother     Social History  Substance Use Topics  . Smoking status: Never Smoker   . Smokeless tobacco: Never Used  . Alcohol Use: No    Allergies:  Allergies  Allergen Reactions  . Amoxicillin Anaphylaxis and Hives    Has patient had a PCN reaction causing immediate rash, facial/tongue/throat swelling, SOB or lightheadedness with hypotension: Yes Has patient had a PCN reaction causing severe rash involving mucus membranes or skin necrosis: No Has patient had a PCN reaction that required hospitalization No Has patient had a PCN reaction occurring within the last 10 years: Yes If all of the above answers are "NO", then may proceed with Cephalosporin use.  . Ibuprofen Anaphylaxis and Hives    Prescriptions prior to admission  Medication Sig Dispense Refill Last Dose  . docusate sodium (COLACE) 100 MG capsule Take 100 mg by mouth daily as needed for mild constipation.    Past Month at Unknown time  . Prenatal Vit-Fe Fumarate-FA (PRENATAL VITAMIN PO) Take 1 tablet by mouth daily.    Past Week at Unknown time    Review of Systems  Constitutional: Negative for fever.  Gastrointestinal: Positive for nausea. Negative for vomiting, diarrhea and constipation.  Genitourinary: Positive for vaginal discharge. Negative for dysuria and frequency.  Musculoskeletal: Negative for back pain.   Physical Exam   Blood pressure 112/68, pulse 100, temperature 97.9 F (36.6 C), temperature source Oral, resp. rate 16, weight 56.881 kg (125 lb 6.4 oz), last menstrual period 08/15/2014, unknown if currently breastfeeding.  Physical Exam  Constitutional: She is oriented to person, place, and time. She appears well-developed and well-nourished. No distress.   HENT:  Head: Normocephalic.  Cardiovascular: Normal rate and regular rhythm.   Respiratory: Effort normal. No respiratory distress.  GI: Soft. She exhibits no distension. There is no tenderness. There is no rebound and no guarding.  Genitourinary: Vaginal discharge (thin white, no clear discharge, no pooling, no ferning) found.  Dilation: Closed Effacement (%): Thick Exam by:: Artelia Laroche CNM   Musculoskeletal: Normal range of motion. She exhibits no edema.  Neurological: She is alert and oriented to person, place, and time.  Skin: Skin is warm and dry. She is not diaphoretic.  Psychiatric: She has a normal mood and affect.  Fetal heart rate reactive Uterine irritability mixed with contractions  MAU Course  Procedures  MDM Consulted Dr Gaynell Face re:  Presentation, symptoms and exam findings.  He ordered Terbutaline Fetal fibronectin sent  >> Negative Terbutaline given for contractions. >> contractions mostly stopped PO hydration   Results for orders placed or performed during the hospital encounter of 03/18/15 (from the past 24 hour(s))  Urinalysis, Routine w reflex microscopic (not at [facility])     Status: Abnormal   Collection Time: 03/18/15 11:35 AM  Result Value Ref Range   Color, Urine YELLOW YELLOW   APPearance HAZY (A) CLEAR   Specific Gravity, Urine 1.025 1.005 - 1.030   pH 6.0 5.0 - 8.0   Glucose, UA NEGATIVE NEGATIVE mg/dL   Hgb urine dipstick TRACE (A) NEGATIVE   Bilirubin Urine NEGATIVE NEGATIVE   Ketones, ur NEGATIVE NEGATIVE mg/dL   Protein, ur NEGATIVE NEGATIVE mg/dL   Urobilinogen, UA 1.0 0.0 - 1.0 mg/dL   Nitrite NEGATIVE NEGATIVE   Leukocytes, UA MODERATE (A) NEGATIVE  Urine microscopic-add on     Status: Abnormal   Collection Time: 03/18/15 11:35 AM  Result Value Ref Range   Squamous Epithelial / LPF MANY (A) RARE   WBC, UA 11-20 <3 WBC/hpf   RBC / HPF 7-10 <3 RBC/hpf   Bacteria, UA MANY (A) RARE   Urine-Other MUCOUS PRESENT   Fetal fibronectin      Status: None   Collection Time: 03/18/15 12:30 PM  Result Value Ref Range   Fetal Fibronectin NEGATIVE NEGATIVE   Nausea improved with Phenergan Assessment and Plan  A:  SIUP at [redacted]w[redacted]d       Preterm contractions, improved after Terbutaline      Leukocytes in urine.       Probable UTI      Nausea    P:  Discharge home      Rx Macrobid for UTI      Urine to culture       PO hydration at home      Rx Phenergan for nausea      Followup in office with DR Gaynell Face for [facility] visit  [facility] 03/18/2015, 1:36 PM

## 2015-03-18 NOTE — MAU Note (Addendum)
Pt states that she is feeling a lot of pressure. "like the baby is going to fall out" Pt reports gush of fluid x3 two days ago and then another gush of fluid yesterday. Pt has had no leaking today. + FM. Denies bleeding. Pt reports generalized achyness all over and nausea. Pt would like some medication for nausea.

## 2015-03-18 NOTE — Discharge Instructions (Signed)
Asymptomatic Bacteriuria, Female Asymptomatic bacteriuria is the presence of a large number of bacteria in your urine without the usual symptoms of burning or frequent urination. The following conditions increase the risk of asymptomatic bacteriuria:  Diabetes mellitus.  Advanced age.  Pregnancy in the first trimester.  Kidney stones.  Kidney transplants.  Leaky kidney tube valve in young children (reflux). Treatment for this condition is not needed in most people and can lead to other problems such as too much yeast and growth of resistant bacteria. However, some people, such as pregnant women, do need treatment to prevent kidney infection. Asymptomatic bacteriuria in pregnancy is also associated with fetal growth restriction, premature labor, and newborn death. HOME CARE INSTRUCTIONS Monitor your condition for any changes. The following actions may help to relieve any discomfort you are feeling:  Drink enough water and fluids to keep your urine clear or pale yellow. Go to the bathroom more often to keep your bladder empty.  Keep the area around your vagina and rectum clean. Wipe yourself from front to back after urinating. SEEK IMMEDIATE MEDICAL CARE IF:  You develop signs of an infection such as:  Burning with urination.  Frequency of voiding.  Back pain.  Fever.  You have blood in the urine.  You develop a fever. MAKE SURE YOU:  Understand these instructions.  Will watch your condition.  Will get help right away if you are not doing well or get worse.   This information is not intended to replace advice given to you by your health care provider. Make sure you discuss any questions you have with your health care provider.   Document Released: 05/24/2005 Document Revised: 06/14/2014 Document Reviewed: 11/13/2012 Elsevier Interactive Patient Education 2016 [employer]. Preterm Labor Information Preterm labor is when labor starts at less than 37 weeks of pregnancy.  The normal length of a pregnancy is 39 to 41 weeks. CAUSES Often, there is no identifiable underlying cause as to why a woman goes into preterm labor. One of the most common known causes of preterm labor is infection. Infections of the uterus, cervix, vagina, amniotic sac, bladder, kidney, or even the lungs (pneumonia) can cause labor to start. Other suspected causes of preterm labor include:   Urogenital infections, such as yeast infections and bacterial vaginosis.   Uterine abnormalities (uterine shape, uterine septum, fibroids, or bleeding from the placenta).   A cervix that has been operated on (it may fail to stay closed).   Malformations in the fetus.   Multiple gestations (twins, triplets, and so on).   Breakage of the amniotic sac.  RISK FACTORS  Having a previous history of preterm labor.   Having premature rupture of membranes (PROM).   Having a placenta that covers the opening of the cervix (placenta previa).   Having a placenta that separates from the uterus (placental abruption).   Having a cervix that is too weak to hold the fetus in the uterus (incompetent cervix).   Having too much fluid in the amniotic sac (polyhydramnios).   Taking illegal drugs or smoking while pregnant.   Not gaining enough weight while pregnant.   Being younger than 81 and older than 29 years old.   Having a low socioeconomic status.   Being African American. SYMPTOMS Signs and symptoms of preterm labor include:   Menstrual-like cramps, abdominal pain, or back pain.  Uterine contractions that are regular, as frequent as six in an hour, regardless of their intensity (may be mild or painful).  Contractions that  start on the top of the uterus and spread down to the lower abdomen and back.   A sense of increased pelvic pressure.   A watery or bloody mucus discharge that comes from the vagina.  TREATMENT Depending on the length of the pregnancy and other  circumstances, your health care provider may suggest bed rest. If necessary, there are medicines that can be given to stop contractions and to mature the fetal lungs. If labor happens before 34 weeks of pregnancy, a prolonged hospital stay may be recommended. Treatment depends on the condition of both you and the fetus.  WHAT SHOULD YOU DO IF YOU THINK YOU ARE IN PRETERM LABOR? Call your health care provider right away. You will need to go to the hospital to get checked immediately. HOW CAN YOU PREVENT PRETERM LABOR IN FUTURE PREGNANCIES? You should:   Stop smoking if you smoke.  Maintain healthy weight gain and avoid chemicals and drugs that are not necessary.  Be watchful for any type of infection.  Inform your health care provider if you have a known history of preterm labor.   This information is not intended to replace advice given to you by your health care provider. Make sure you discuss any questions you have with your health care provider.   Document Released: 08/14/2003 Document Revised: 01/24/2013 Document Reviewed: 06/26/2012 Elsevier Interactive Patient Education [employer]

## 2015-03-18 NOTE — MAU Note (Signed)
Feeling nauseous, body aches.   Hurt all over.  abd gets real tight, then relaxes, started last night.  Had a gush of clear fluid 2 days  Ago, twice yesterday; clear odorless fluid.

## 2015-03-20 LAB — CULTURE, OB URINE
Culture: >=100000
Special Requests: NORMAL

## 2015-04-18 LAB — OB RESULTS CONSOLE HEPATITIS B SURFACE ANTIGEN: HEP B S AG: NEGATIVE

## 2015-04-18 LAB — OB RESULTS CONSOLE GC/CHLAMYDIA
Chlamydia: NEGATIVE
Gonorrhea: NEGATIVE

## 2015-04-18 LAB — OB RESULTS CONSOLE RUBELLA ANTIBODY, IGM: Rubella: IMMUNE

## 2015-04-18 LAB — OB RESULTS CONSOLE RPR: RPR: NONREACTIVE

## 2015-04-18 LAB — OB RESULTS CONSOLE ABO/RH: RH TYPE: POSITIVE

## 2015-04-18 LAB — OB RESULTS CONSOLE HIV ANTIBODY (ROUTINE TESTING): HIV: NONREACTIVE

## 2015-04-18 LAB — OB RESULTS CONSOLE ANTIBODY SCREEN: ANTIBODY SCREEN: NEGATIVE

## 2015-05-06 ENCOUNTER — Encounter (HOSPITAL_COMMUNITY): Payer: Self-pay | Admitting: *Deleted

## 2015-05-06 ENCOUNTER — Other Ambulatory Visit: Payer: Self-pay | Admitting: Obstetrics

## 2015-05-06 ENCOUNTER — Telehealth (HOSPITAL_COMMUNITY): Payer: Self-pay | Admitting: *Deleted

## 2015-05-06 LAB — OB RESULTS CONSOLE GBS: STREP GROUP B AG: NEGATIVE

## 2015-05-06 NOTE — Telephone Encounter (Signed)
Preadmission screen  

## 2015-05-10 ENCOUNTER — Encounter (HOSPITAL_COMMUNITY)
Admission: AD | Disposition: A | Discharge status: Home / Self Care | Payer: Self-pay | Source: Ambulatory Visit | Attending: Obstetrics

## 2015-05-10 ENCOUNTER — Inpatient Hospital Stay (HOSPITAL_COMMUNITY): Payer: Medicaid Other | Admitting: Anesthesiology

## 2015-05-10 ENCOUNTER — Inpatient Hospital Stay (HOSPITAL_COMMUNITY)
Admission: AD | Admit: 2015-05-10 | Discharge: 2015-05-13 | DRG: 766 | Disposition: A | Discharge status: Home / Self Care | Payer: Medicaid Other | Source: Ambulatory Visit | Attending: Obstetrics | Admitting: Obstetrics

## 2015-05-10 ENCOUNTER — Encounter (HOSPITAL_COMMUNITY): Payer: Self-pay | Admitting: *Deleted

## 2015-05-10 DIAGNOSIS — R000 Tachycardia, unspecified: Secondary | ICD-10-CM | POA: Diagnosis present

## 2015-05-10 DIAGNOSIS — F329 Major depressive disorder, single episode, unspecified: Secondary | ICD-10-CM | POA: Diagnosis present

## 2015-05-10 DIAGNOSIS — Z98891 History of uterine scar from previous surgery: Secondary | ICD-10-CM

## 2015-05-10 DIAGNOSIS — O9902 Anemia complicating childbirth: Secondary | ICD-10-CM | POA: Diagnosis present

## 2015-05-10 DIAGNOSIS — O4590 Premature separation of placenta, unspecified, unspecified trimester: Secondary | ICD-10-CM | POA: Diagnosis present

## 2015-05-10 DIAGNOSIS — O99344 Other mental disorders complicating childbirth: Secondary | ICD-10-CM | POA: Diagnosis present

## 2015-05-10 LAB — CBC
HEMATOCRIT: 28.9 % — AB (ref 36.0–46.0)
HEMATOCRIT: 28.9 % — AB (ref 36.0–46.0)
HEMOGLOBIN: 9.5 g/dL — AB (ref 12.0–15.0)
HEMOGLOBIN: 9.6 g/dL — AB (ref 12.0–15.0)
MCH: 32.5 pg (ref 26.0–34.0)
MCH: 32 pg (ref 26.0–34.0)
MCHC: 32.9 g/dL (ref 30.0–36.0)
MCHC: 33.2 g/dL (ref 30.0–36.0)
MCV: 99 fL (ref 78.0–100.0)
MCV: 96.3 fL (ref 78.0–100.0)
Platelets: 219 10*3/uL (ref 150–400)
Platelets: 156 10*3/uL (ref 150–400)
RBC: 2.92 MIL/uL — AB (ref 3.87–5.11)
RBC: 3 MIL/uL — AB (ref 3.87–5.11)
RDW: 14.5 % (ref 11.5–15.5)
RDW: 14.8 % (ref 11.5–15.5)
WBC: 9.3 10*3/uL (ref 4.0–10.5)
WBC: 13.3 10*3/uL — ABNORMAL HIGH (ref 4.0–10.5)

## 2015-05-10 LAB — RPR: RPR Ser Ql: NONREACTIVE

## 2015-05-10 LAB — ABO/RH: ABO/RH(D): O POS

## 2015-05-10 LAB — PREPARE RBC (CROSSMATCH)

## 2015-05-10 SURGERY — Surgical Case
Anesthesia: Epidural

## 2015-05-10 MED ORDER — MORPHINE SULFATE (PF) 0.5 MG/ML IJ SOLN
INTRAMUSCULAR | Status: DC | PRN
  Administered 2015-05-10: 3 mg via EPIDURAL

## 2015-05-10 MED ORDER — ONDANSETRON HCL 4 MG/2ML IJ SOLN
INTRAMUSCULAR | Status: DC | PRN
  Administered 2015-05-10: 4 mg via INTRAVENOUS

## 2015-05-10 MED ORDER — SODIUM CHLORIDE 0.9 % IV SOLN
Freq: Once | INTRAVENOUS | Status: AC
  Administered 2015-05-10: 06:00:00 via INTRAVENOUS

## 2015-05-10 MED ORDER — PHENYLEPHRINE HCL 10 MG/ML IJ SOLN
INTRAMUSCULAR | Status: DC | PRN
Start: 2015-05-10 — End: 2015-05-10
  Administered 2015-05-10 (×4): 40 ug via INTRAVENOUS

## 2015-05-10 MED ORDER — SODIUM CHLORIDE 0.9 % IR SOLN
Status: DC | PRN
  Administered 2015-05-10: 1000 mL

## 2015-05-10 MED ORDER — LIDOCAINE HCL (PF) 1 % IJ SOLN
INTRAMUSCULAR | Status: DC | PRN
  Administered 2015-05-10 (×2): 4 mL via EPIDURAL

## 2015-05-10 MED ORDER — TETANUS-DIPHTH-ACELL PERTUSSIS 5-2.5-18.5 LF-MCG/0.5 IM SUSP
0.5000 mL | Freq: Once | INTRAMUSCULAR | Status: DC
  Filled 2015-05-10: qty 0.5

## 2015-05-10 MED ORDER — ACETAMINOPHEN 325 MG PO TABS
650.0000 mg | ORAL_TABLET | ORAL | Status: DC | PRN
  Administered 2015-05-10: 650 mg via ORAL
  Filled 2015-05-10: qty 2

## 2015-05-10 MED ORDER — FENTANYL CITRATE (PF) 100 MCG/2ML IJ SOLN: 25.0000 ug | INTRAMUSCULAR | Status: DC | PRN

## 2015-05-10 MED ORDER — SIMETHICONE 80 MG PO CHEW
80.0000 mg | CHEWABLE_TABLET | Freq: Three times a day (TID) | ORAL | Status: DC
  Administered 2015-05-10 – 2015-05-13 (×7): 80 mg via ORAL
  Filled 2015-05-10 (×14): qty 1

## 2015-05-10 MED ORDER — FENTANYL CITRATE (PF) 100 MCG/2ML IJ SOLN: 50.0000 ug | INTRAMUSCULAR | Status: DC | PRN

## 2015-05-10 MED ORDER — LACTATED RINGERS IV SOLN
INTRAVENOUS | Status: DC
  Administered 2015-05-10 (×4): via INTRAVENOUS

## 2015-05-10 MED ORDER — FENTANYL 2.5 MCG/ML BUPIVACAINE 1/10 % EPIDURAL INFUSION (WH - ANES)
14.0000 mL/h | INTRAMUSCULAR | Status: DC | PRN
  Administered 2015-05-10: 14 mL/h via EPIDURAL
  Administered 2015-05-10: 12.5 mL/h via EPIDURAL
  Filled 2015-05-10: qty 125

## 2015-05-10 MED ORDER — PHENYLEPHRINE 40 MCG/ML (10ML) SYRINGE FOR IV PUSH (FOR BLOOD PRESSURE SUPPORT)
PREFILLED_SYRINGE | INTRAVENOUS | Status: AC
  Filled 2015-05-10: qty 10

## 2015-05-10 MED ORDER — ZOLPIDEM TARTRATE 5 MG PO TABS: 5.0000 mg | ORAL_TABLET | Freq: Every evening | ORAL | Status: DC | PRN

## 2015-05-10 MED ORDER — OXYTOCIN 10 UNIT/ML IJ SOLN
40.0000 [IU] | INTRAVENOUS | Status: DC | PRN
  Administered 2015-05-10: 40 [IU] via INTRAVENOUS

## 2015-05-10 MED ORDER — NALBUPHINE HCL 10 MG/ML IJ SOLN: 5.0000 mg | INTRAMUSCULAR | Status: DC | PRN

## 2015-05-10 MED ORDER — IBUPROFEN 600 MG PO TABS: 600.0000 mg | ORAL_TABLET | Freq: Four times a day (QID) | ORAL | Status: DC

## 2015-05-10 MED ORDER — LACTATED RINGERS IV SOLN
INTRAVENOUS | Status: DC | PRN
  Administered 2015-05-10 (×2): via INTRAVENOUS

## 2015-05-10 MED ORDER — NALOXONE HCL 2 MG/2ML IJ SOSY
1.0000 ug/kg/h | PREFILLED_SYRINGE | INTRAVENOUS | Status: DC | PRN
  Filled 2015-05-10: qty 2

## 2015-05-10 MED ORDER — SCOPOLAMINE 1 MG/3DAYS TD PT72
MEDICATED_PATCH | TRANSDERMAL | Status: DC | PRN
  Administered 2015-05-10: 1 via TRANSDERMAL

## 2015-05-10 MED ORDER — PHENYLEPHRINE 8 MG IN D5W 100 ML (0.08MG/ML) PREMIX OPTIME
INJECTION | INTRAVENOUS | Status: DC | PRN
  Administered 2015-05-10: 60 ug/min via INTRAVENOUS

## 2015-05-10 MED ORDER — NALBUPHINE HCL 10 MG/ML IJ SOLN: 5.0000 mg | Freq: Once | INTRAMUSCULAR | Status: DC | PRN

## 2015-05-10 MED ORDER — PRENATAL MULTIVITAMIN CH
1.0000 | ORAL_TABLET | Freq: Every day | ORAL | Status: DC
  Administered 2015-05-11 – 2015-05-12 (×2): 1 via ORAL
  Filled 2015-05-10 (×4): qty 1

## 2015-05-10 MED ORDER — OXYCODONE-ACETAMINOPHEN 5-325 MG PO TABS
2.0000 | ORAL_TABLET | ORAL | Status: DC | PRN
  Administered 2015-05-10 – 2015-05-13 (×6): 2 via ORAL
  Filled 2015-05-10 (×6): qty 2

## 2015-05-10 MED ORDER — SIMETHICONE 80 MG PO CHEW
80.0000 mg | CHEWABLE_TABLET | ORAL | Status: DC | PRN
  Filled 2015-05-10: qty 1

## 2015-05-10 MED ORDER — MENTHOL 3 MG MT LOZG
1.0000 | LOZENGE | OROMUCOSAL | Status: DC | PRN
  Filled 2015-05-10: qty 9

## 2015-05-10 MED ORDER — SCOPOLAMINE 1 MG/3DAYS TD PT72
MEDICATED_PATCH | TRANSDERMAL | Status: AC
  Filled 2015-05-10: qty 1

## 2015-05-10 MED ORDER — DIPHENHYDRAMINE HCL 25 MG PO CAPS
25.0000 mg | ORAL_CAPSULE | ORAL | Status: DC | PRN
  Filled 2015-05-10: qty 1

## 2015-05-10 MED ORDER — OXYCODONE-ACETAMINOPHEN 5-325 MG PO TABS
1.0000 | ORAL_TABLET | ORAL | Status: DC | PRN
  Administered 2015-05-11 – 2015-05-12 (×6): 1 via ORAL
  Filled 2015-05-10 (×6): qty 1

## 2015-05-10 MED ORDER — OXYTOCIN BOLUS FROM INFUSION: 500.0000 mL | INTRAVENOUS | Status: DC

## 2015-05-10 MED ORDER — ONDANSETRON HCL 4 MG/2ML IJ SOLN: 4.0000 mg | Freq: Three times a day (TID) | INTRAMUSCULAR | Status: DC | PRN

## 2015-05-10 MED ORDER — LACTATED RINGERS IV SOLN
INTRAVENOUS | Status: DC
  Administered 2015-05-10 – 2015-05-11 (×3): via INTRAVENOUS

## 2015-05-10 MED ORDER — TERBUTALINE SULFATE 1 MG/ML IJ SOLN: 0.2500 mg | Freq: Once | INTRAMUSCULAR | Status: DC | PRN

## 2015-05-10 MED ORDER — DIPHENHYDRAMINE HCL 50 MG/ML IJ SOLN: 12.5000 mg | INTRAMUSCULAR | Status: DC | PRN

## 2015-05-10 MED ORDER — LIDOCAINE-EPINEPHRINE (PF) 2 %-1:200000 IJ SOLN
INTRAMUSCULAR | Status: AC
  Filled 2015-05-10: qty 20

## 2015-05-10 MED ORDER — NALOXONE HCL 0.4 MG/ML IJ SOLN: 0.4000 mg | INTRAMUSCULAR | Status: DC | PRN

## 2015-05-10 MED ORDER — WITCH HAZEL-GLYCERIN EX PADS: 1.0000 "application " | MEDICATED_PAD | CUTANEOUS | Status: DC | PRN

## 2015-05-10 MED ORDER — ONDANSETRON HCL 4 MG/2ML IJ SOLN: 4.0000 mg | Freq: Four times a day (QID) | INTRAMUSCULAR | Status: DC | PRN

## 2015-05-10 MED ORDER — ONDANSETRON HCL 4 MG/2ML IJ SOLN
INTRAMUSCULAR | Status: AC
  Filled 2015-05-10: qty 2

## 2015-05-10 MED ORDER — NALBUPHINE HCL 10 MG/ML IJ SOLN
5.0000 mg | INTRAMUSCULAR | Status: DC | PRN
  Administered 2015-05-11: 5 mg via SUBCUTANEOUS
  Filled 2015-05-10: qty 1

## 2015-05-10 MED ORDER — ACETAMINOPHEN 325 MG PO TABS: 650.0000 mg | ORAL_TABLET | ORAL | Status: DC | PRN

## 2015-05-10 MED ORDER — OXYTOCIN 10 UNIT/ML IJ SOLN
INTRAMUSCULAR | Status: AC
  Filled 2015-05-10: qty 4

## 2015-05-10 MED ORDER — CITRIC ACID-SODIUM CITRATE 334-500 MG/5ML PO SOLN
30.0000 mL | ORAL | Status: DC | PRN
  Administered 2015-05-10: 30 mL via ORAL
  Filled 2015-05-10: qty 15

## 2015-05-10 MED ORDER — MEPERIDINE HCL 25 MG/ML IJ SOLN: 6.2500 mg | INTRAMUSCULAR | Status: DC | PRN

## 2015-05-10 MED ORDER — SENNOSIDES-DOCUSATE SODIUM 8.6-50 MG PO TABS
2.0000 | ORAL_TABLET | ORAL | Status: DC
  Administered 2015-05-11 – 2015-05-12 (×3): 2 via ORAL
  Filled 2015-05-10 (×5): qty 2

## 2015-05-10 MED ORDER — DIBUCAINE 1 % RE OINT: 1.0000 "application " | TOPICAL_OINTMENT | RECTAL | Status: DC | PRN

## 2015-05-10 MED ORDER — PHENYLEPHRINE 40 MCG/ML (10ML) SYRINGE FOR IV PUSH (FOR BLOOD PRESSURE SUPPORT)
80.0000 ug | PREFILLED_SYRINGE | INTRAVENOUS | Status: AC | PRN
  Administered 2015-05-10 (×3): 80 ug via INTRAVENOUS
  Filled 2015-05-10: qty 20

## 2015-05-10 MED ORDER — SODIUM BICARBONATE 8.4 % IV SOLN
INTRAVENOUS | Status: DC | PRN
  Administered 2015-05-10 (×3): 5 mL via EPIDURAL

## 2015-05-10 MED ORDER — CEFAZOLIN SODIUM-DEXTROSE 2-3 GM-% IV SOLR
INTRAVENOUS | Status: DC | PRN
  Administered 2015-05-10: 2 g via INTRAVENOUS

## 2015-05-10 MED ORDER — CEFAZOLIN SODIUM-DEXTROSE 2-3 GM-% IV SOLR
INTRAVENOUS | Status: AC
  Filled 2015-05-10: qty 50

## 2015-05-10 MED ORDER — SODIUM CHLORIDE 0.9 % IJ SOLN: 3.0000 mL | INTRAMUSCULAR | Status: DC | PRN

## 2015-05-10 MED ORDER — FLEET ENEMA 7-19 GM/118ML RE ENEM: 1.0000 | ENEMA | RECTAL | Status: DC | PRN

## 2015-05-10 MED ORDER — SODIUM BICARBONATE 8.4 % IV SOLN
INTRAVENOUS | Status: AC
  Filled 2015-05-10: qty 50

## 2015-05-10 MED ORDER — OXYTOCIN 40 UNITS IN LACTATED RINGERS INFUSION - SIMPLE MED: 62.5000 mL/h | INTRAVENOUS | Status: AC

## 2015-05-10 MED ORDER — OXYTOCIN 40 UNITS IN LACTATED RINGERS INFUSION - SIMPLE MED
62.5000 mL/h | INTRAVENOUS | Status: DC
  Filled 2015-05-10: qty 1000

## 2015-05-10 MED ORDER — DIPHENHYDRAMINE HCL 25 MG PO CAPS
25.0000 mg | ORAL_CAPSULE | Freq: Four times a day (QID) | ORAL | Status: DC | PRN
  Administered 2015-05-10: 25 mg via ORAL
  Filled 2015-05-10 (×2): qty 1

## 2015-05-10 MED ORDER — LACTATED RINGERS IV SOLN
500.0000 mL | INTRAVENOUS | Status: DC | PRN
  Administered 2015-05-10 (×3): 500 mL via INTRAVENOUS

## 2015-05-10 MED ORDER — LIDOCAINE HCL (PF) 1 % IJ SOLN
30.0000 mL | INTRAMUSCULAR | Status: DC | PRN
  Filled 2015-05-10: qty 30

## 2015-05-10 MED ORDER — LANOLIN HYDROUS EX OINT: 1.0000 "application " | TOPICAL_OINTMENT | CUTANEOUS | Status: DC | PRN

## 2015-05-10 MED ORDER — MORPHINE SULFATE (PF) 0.5 MG/ML IJ SOLN
INTRAMUSCULAR | Status: AC
  Filled 2015-05-10: qty 10

## 2015-05-10 MED ORDER — EPHEDRINE 5 MG/ML INJ
10.0000 mg | INTRAVENOUS | Status: DC | PRN
Start: 2015-05-10 — End: 2015-05-10
  Administered 2015-05-10: 10 mg via INTRAVENOUS
  Filled 2015-05-10: qty 4

## 2015-05-10 MED ORDER — SIMETHICONE 80 MG PO CHEW
80.0000 mg | CHEWABLE_TABLET | ORAL | Status: DC
  Administered 2015-05-11 – 2015-05-12 (×3): 80 mg via ORAL
  Filled 2015-05-10 (×5): qty 1

## 2015-05-10 SURGICAL SUPPLY — 42 items
BENZOIN TINCTURE PRP APPL 2/3 (GAUZE/BANDAGES/DRESSINGS) IMPLANT
CLAMP CORD UMBIL (MISCELLANEOUS) IMPLANT
CLOSURE WOUND 1/2 X4 (GAUZE/BANDAGES/DRESSINGS)
CLOTH BEACON ORANGE TIMEOUT ST (SAFETY) ×6 IMPLANT
DRAPE SHEET LG 3/4 BI-LAMINATE (DRAPES) IMPLANT
DRSG OPSITE POSTOP 4X10 (GAUZE/BANDAGES/DRESSINGS) ×3 IMPLANT
DURAPREP 26ML APPLICATOR (WOUND CARE) ×3 IMPLANT
ELECT REM PT RETURN 9FT ADLT (ELECTROSURGICAL) ×3
ELECTRODE REM PT RTRN 9FT ADLT (ELECTROSURGICAL) ×1 IMPLANT
EXTRACTOR VACUUM KIWI (MISCELLANEOUS) IMPLANT
GLOVE BIO SURGEON ST LM GN SZ9 (GLOVE) ×3 IMPLANT
GLOVE BIOGEL PI IND STRL 7.0 (GLOVE) ×1 IMPLANT
GLOVE BIOGEL PI IND STRL 9 (GLOVE) ×1 IMPLANT
GLOVE BIOGEL PI INDICATOR 7.0 (GLOVE) ×2
GLOVE BIOGEL PI INDICATOR 9 (GLOVE) ×2
GLOVE SS BIOGEL STRL SZ 8.5 (GLOVE) ×1 IMPLANT
GLOVE SUPERSENSE BIOGEL SZ 8.5 (GLOVE) ×2
GOWN STRL REUS W/TWL 2XL LVL3 (GOWN DISPOSABLE) ×3 IMPLANT
GOWN STRL REUS W/TWL LRG LVL3 (GOWN DISPOSABLE) ×3 IMPLANT
NEEDLE HYPO 25X5/8 SAFETYGLIDE (NEEDLE) IMPLANT
NS IRRIG 1000ML POUR BTL (IV SOLUTION) ×3 IMPLANT
PACK C SECTION WH (CUSTOM PROCEDURE TRAY) ×3 IMPLANT
PAD ABD 8X10 STRL (GAUZE/BANDAGES/DRESSINGS) ×3 IMPLANT
PAD OB MATERNITY 4.3X12.25 (PERSONAL CARE ITEMS) ×3 IMPLANT
PENCIL SMOKE EVAC W/HOLSTER (ELECTROSURGICAL) ×3 IMPLANT
RTRCTR C-SECT PINK 25CM LRG (MISCELLANEOUS) IMPLANT
RTRCTR C-SECT PINK 34CM XLRG (MISCELLANEOUS) IMPLANT
STRIP CLOSURE SKIN 1/2X4 (GAUZE/BANDAGES/DRESSINGS) IMPLANT
SUT MNCRL 0 VIOLET CTX 36 (SUTURE) ×2 IMPLANT
SUT MON AB 4-0 PS1 27 (SUTURE) ×3 IMPLANT
SUT MONOCRYL 0 CTX 36 (SUTURE) ×4
SUT PLAIN 2 0 (SUTURE) ×2
SUT PLAIN ABS 2-0 CT1 27XMFL (SUTURE) ×1 IMPLANT
SUT VIC AB 0 CT1 27 (SUTURE) ×2
SUT VIC AB 0 CT1 27XBRD ANBCTR (SUTURE) ×1 IMPLANT
SUT VIC AB 2-0 CT1 27 (SUTURE) ×2
SUT VIC AB 2-0 CT1 TAPERPNT 27 (SUTURE) ×1 IMPLANT
SUT VIC AB 4-0 KS 27 (SUTURE) ×3 IMPLANT
SYR BULB IRRIGATION 50ML (SYRINGE) IMPLANT
TAPE HYPAFIX 4 X10 (GAUZE/BANDAGES/DRESSINGS) ×3 IMPLANT
TOWEL OR 17X24 6PK STRL BLUE (TOWEL DISPOSABLE) ×3 IMPLANT
TRAY FOLEY CATH SILVER 14FR (SET/KITS/TRAYS/PACK) ×3 IMPLANT

## 2015-05-10 NOTE — Lactation Note (Signed)
This note was copied from the chart of Natalie Mayra NeerDemekka Swisher. Lactation Consultation Note  Patient Name: [name] MWUXL'KToday's Date: 05/10/2015 Reason for consult: Initial assessment Baby is 5 hours old [facility]. Baby was born at 7516w6d with a birth wt of 7+11.3#. Mom reports she has BF her other children and the most recent she BF for 4-5 months at which time she stated her milk started to dry up because she went back to work. Infant was crying in crib when [facility] entered and mom asked female visitor to check on infant; he stated that maybe she's hungry but mom stated that she just fed him ~1hr ago for 20 mins so didn't feel like she could be hungry again. Female visitor put pacifier in baby's mouth. LC discussed how babies can go through cluster feeding in the beginning and that she may want to put infant skin to skin and see if she'll go to the breast. Also encouraged mom to avoid pacifiers and bottles for the first few weeks. LC noted that baby was spitting some clear liquid up so LC sat baby up and patted infant's back but infant did not spit anymore up & was content when laid back in crib. Provided mom with BF booklet & feeding logs; discussed out patient services & support groups. More visitors entered room so LC encouraged mom to call for LC at future feeding for latch assistance.   Maternal Data Does the patient have breastfeeding experience prior to this delivery?: Yes  Feeding Feeding Type: Breast Fed Length of feed: 20 min (per mom)  LATCH Score/Interventions Latch: Grasps breast easily, tongue down, lips flanged, rhythmical sucking.  Audible Swallowing: A few with stimulation Intervention(s): Skin to skin  Type of Nipple: Everted at rest and after stimulation  Comfort (Breast/Nipple): Soft / non-tender     Hold (Positioning): Assistance needed to correctly position infant at breast and maintain latch.  LATCH Score: 8  Lactation Tools Discussed/Used     Consult Status Consult Status: Follow-up Date: 05/11/15 Follow-up type: In-patient    Oneal GroutLaura C Amman Bartel 05/10/2015, 11:37 AM

## 2015-05-10 NOTE — H&P (Signed)
This is Dr. Francoise CeoBernard [name] dictating the history and physical on  [name]  she is a 29 year old gravida 7 para 4024 EDC 05/11/2015 negative GBS admitted in labor she 6 cm 100% -1 station amniotomy performed and the fluids clear it was noted that her bleeding was bright red and had get unusual the uterus did retractor did relax in between contraction but she could have a possible him marginal abruption the baby is reactive and will observe Past medical history negative Past surgical history she had umbilical repair with mesh 2 Social history negative System review negative Physical exam well-developed female in labor HEENT negative Clark lungs clear to P&A Heart regular rhythm no murmurs no gallops lungs Breasts negative Abdomen term Pelvic as described above Extremities negative

## 2015-05-10 NOTE — Transfer of Care (Signed)
Immediate Anesthesia Transfer of Care Note  Patient: [name]  Procedure(s) Performed: Procedure(s): CESAREAN SECTION (N/A)  Patient Location: PACU  Anesthesia Type:Epidural  Level of Consciousness: awake and alert   Airway & Oxygen Therapy: Patient Spontanous Breathing  Post-op Assessment: Report given to RN and Post -op Vital signs reviewed and stable  Post vital signs: Reviewed  Last Vitals:  Filed Vitals:   05/10/15 0539 05/10/15 0540  BP:  76/44  Pulse: 146 148  Temp:    Resp:  20    Complications: No apparent anesthesia complications

## 2015-05-10 NOTE — Consult Note (Signed)
Neonatology Note:   Attendance at C-section:    I was asked by Dr. Emelda FearFerguson to attend this Stat primary C/S at term due to suspected abruption. The mother is a G7P4A2 O pos, GBS neg with an uncomplicated pregnancy. She presented in labor today, AROM with clear fluid, but also some bright red bleeding. ROM 1 hour prior to delivery, fluid grossly bloody. Abruption confirmed. Infant vigorous with good spontaneous cry and tone. Needed only minimal bulb suctioning. Ap 8/9. Lungs clear to ausc in DR. To CN to care of Pediatrician.   Doretha Souhristie C. Dawsyn Zurn, MD

## 2015-05-10 NOTE — Anesthesia Procedure Notes (Signed)
Epidural Patient location during procedure: OB Start time: 05/10/2015 4:39 AM  Staffing Anesthesiologist: Shona SimpsonHOLLIS, KEVIN D Performed by: anesthesiologist   Preanesthetic Checklist Completed: patient identified, site marked, surgical consent, pre-op evaluation, timeout performed, IV checked, risks and benefits discussed and monitors and equipment checked  Epidural Patient position: sitting Prep: site prepped and draped and DuraPrep Patient monitoring: continuous pulse ox and blood pressure Approach: midline Location: L3-L4 Injection technique: LOR air  Needle:  Needle type: Tuohy  Needle gauge: 17 G Needle length: 9 cm and 9 Needle insertion depth: 4 cm Catheter type: closed end flexible Catheter size: 19 Gauge Catheter at skin depth: 9 cm Test dose: negative and Other  Assessment Events: blood not aspirated, injection not painful, no injection resistance, negative IV test and no paresthesia  Additional Notes Patient identified. Risks and benefits discussed including failed block, incomplete  Pain control, post dural puncture headache, nerve damage, paralysis, blood pressure Changes, nausea, vomiting, reactions to medications-both toxic and allergic and post Partum back pain. All questions were answered. Patient expressed understanding and wished to proceed. Sterile technique was used throughout procedure. Epidural site was Dressed with sterile barrier dressing. No paresthesias, signs of intravascular injection Or signs of intrathecal spread were encountered.  Patient was more comfortable after the epidural was dosed. Please see RN's note for documentation of vital signs and FHR which are stable.

## 2015-05-10 NOTE — Anesthesia Postprocedure Evaluation (Deleted)
Anesthesia Post Note  Patient: [name]  Procedure(s) Performed: Procedure(s) (LRB): CESAREAN SECTION (N/A)  Patient location during evaluation: Mother Baby Anesthesia Type: Epidural Level of consciousness: awake and alert, oriented and patient cooperative Pain management: pain level controlled Vital Signs Assessment: post-procedure vital signs reviewed and stable Respiratory status: spontaneous breathing Cardiovascular status: stable Postop Assessment: no headache, epidural receding, patient able to bend at knees and no signs of nausea or vomiting Anesthetic complications: no    Last Vitals:  Filed Vitals:   05/10/15 1300 05/10/15 1336  BP: 90/50 95/56  Pulse: 81 78  Temp:    Resp: 20 18    Last Pain:  Filed Vitals:   05/10/15 1338  PainSc: 4                  Christyn Gutkowski

## 2015-05-10 NOTE — Anesthesia Postprocedure Evaluation (Signed)
Anesthesia Post Note  Patient: [name]  Procedure(s) Performed: Procedure(s) (LRB): CESAREAN SECTION (N/A)  Patient location during evaluation: PACU Anesthesia Type: Epidural Level of consciousness: oriented and awake and alert Pain management: pain level controlled Vital Signs Assessment: post-procedure vital signs reviewed and stable Respiratory status: spontaneous breathing and respiratory function stable Cardiovascular status: blood pressure returned to baseline and stable Postop Assessment: no headache and no backache Anesthetic complications: no    Last Vitals:  Filed Vitals:   05/10/15 0830 05/10/15 0845  BP: 98/64   Pulse: 92 92  Temp:    Resp: 18 16    Last Pain:  Filed Vitals:   05/10/15 0852  PainSc: 10-Worst pain ever                 Phillips Groutarignan, Aneesah Hernan

## 2015-05-10 NOTE — Anesthesia Preprocedure Evaluation (Addendum)
Anesthesia Evaluation  Patient identified by MRN, date of birth, ID band  Reviewed: Allergy & Precautions, H&P , Patient's Chart, lab work & pertinent test results  Airway Mallampati: I  TM Distance: >3 FB Neck ROM: full    Dental  (+) Teeth Intact   Pulmonary neg pulmonary ROS,    Pulmonary exam normal        Cardiovascular negative cardio ROS Normal cardiovascular exam Rhythm:regular Rate:Normal     Neuro/Psych PSYCHIATRIC DISORDERS Depression negative neurological ROS     GI/Hepatic Neg liver ROS,   Endo/Other  negative endocrine ROS  Renal/GU negative Renal ROS  negative genitourinary   Musculoskeletal   Abdominal   Peds  Hematology  (+) anemia ,   Anesthesia Other Findings   Reproductive/Obstetrics (+) Pregnancy                             Anesthesia Physical Anesthesia Plan  ASA: II and emergent  Anesthesia Plan: Epidural   Post-op Pain Management:    Induction:   Airway Management Planned: Natural Airway  Additional Equipment:   Intra-op Plan:   Post-operative Plan:   Informed Consent: I have reviewed the patients History and Physical, chart, labs and discussed the procedure including the risks, benefits and alternatives for the proposed anesthesia with the patient or authorized representative who has indicated his/her understanding and acceptance.   Dental advisory given  Plan Discussed with: CRNA, Anesthesiologist and Surgeon  Anesthesia Plan Comments: (Suspected placental abruption for urgent C/Section. Will use epidural for C/Section. M. Malen GauzeFoster, MD)       Anesthesia Quick Evaluation

## 2015-05-10 NOTE — Brief Op Note (Signed)
05/10/2015  6:44 AM  PATIENT:  [name]  29 y.o. female  PRE-OPERATIVE DIAGNOSIS:  primary cesarean section for placental abruption  POST-OPERATIVE DIAGNOSIS:  primary cesarean section for placental abruption  PROCEDURE:  Procedure(s): CESAREAN SECTION (N/A)  SURGEON:  Surgeon(s) and Role:    * Tilda BurrowJohn Alann Avey V, MD - Primary    * Kathreen CosierBernard A Marshall, MD  PHYSICIAN ASSISTANT:   ASSISTANTS: none   ANESTHESIA:   epidural  EBL:  Total I/O In: 654 [Blood:654] Out: 625 [Urine:225; Blood:400]  BLOOD ADMINISTERED:Units packed cells CC PRBC 2 units packed cells Intra-Op DRAINS: Urinary Catheter (Foley)   LOCAL MEDICATIONS USED:  NONE  SPECIMEN:  Source of Specimen:  Placenta to pathology  DISPOSITION OF SPECIMEN:  PATHOLOGY  COUNTS:  YES  TOURNIQUET:  * No tourniquets in log *  DICTATION: .Dragon Dictation  PLAN OF CARE: Patient has active admission order  PATIENT DISPOSITION:  PACU - hemodynamically stable.   Delay start of Pharmacological VTE agent (>24hrs) due to surgical blood loss or risk of bleeding: not applicable   Indications: I was called to the patient room shortly before 6 AM for increased vaginal bleeding maternal tachycardia associated with the known placental abruption. The patient was checked by the nurse was 6 cm. Should past roughly 500 cc of measured blood loss prior to my arrival and then had a 150 cc clot expelled upon my arrival, cervical exam was 6 cm by the nurse. Type and crossmatch was ordered Foley catheter inserted. Fetal heart rate tracing was category 2, due to  decelerations with contractions, with the beat-to-beat variability was present. In 5 minutes the patient and progressed to 8 cm 100% -2 by my exam with passage another clot. Upon Dr. Mercy RidingFoster's arrival a second IV line was placed. Repeat exam showed no change in cervix so decision to proceed to cesarean section was made. The patient confirmed her desire for sterilization to be  performed at the time of the C-section   Details of procedure: Patient was taken operating room prepped and draped with splash preparation due to the urgency of the circumstance. Fetal heart rate was documented as greater than 100. Anesthesia was documented as adequate for incision, and transverse lower abdominal incision was performed in standard fashion with entry of the peritoneal cavity transverse incision made on the lower uterine segment and the large number of clots probably 400 cc were risks bowel prior to deliver the fetal vertex. The infant was passed to the pediatricians in good condition. Placenta delivered in response to Pitocin and Cred uterine massage. Inspection of the placenta showed be intact. There been a marginal abruption due to the anterior lower margin of the placenta, less than 5% of the placenta was abrupted. Uterine tone was excellent. There was no evidence of [place] uterus. Uterus was irrigated out. The uterine closure was a 2 layer closure. At this time Dr. Gaynell FaceMarshall was available to assume responsibilities for the procedure, verbal report of the procedure today was given to Dr. Gaynell FaceMarshall who will proceed with the tubal sterilization and closure of the abdomen. Sponge count was correct at the time of closure of the uterus.  Please see Dr. Elsie StainMarshall's notes for remainder of operative details

## 2015-05-10 NOTE — Op Note (Signed)
Preop diagnosis abruption of the placenta Postop diagnosis the same and multiparity with tubal ligation Anesthesia epidural Surgeon Dr. Emelda FearFerguson and and Dr. Gaynell FaceMarshall Procedure patient taken to the operating room for stat C-section by Dr. Emelda FearFerguson and he dictated the delivery of the baby I came in after the uterus was closed and decided to do the tubal ligation the right tube was grasped in the Babcock clamp 0 plain suture placed in the mesial salpinx below the portion of tube within the clamp this was tied and cut in approximately 1 inch or 2 transected the procedure was done in a similar fashion genocide lap and sponge counts correct abdomen closed in layers peritoneum continuous table chromic fascia continuous with Demodex on and the skin closes supple continuous subcuticular stitch of 4-0 Monocryl blood loss was 1400 cc patient tolerated procedure well taken to recovery room in good condition

## 2015-05-10 NOTE — Progress Notes (Signed)
Unable to obtain oral or axillary temps.  Bair hugger placed on top of patient.

## 2015-05-10 NOTE — MAU Note (Signed)
Contractions on and off all day Friday. Regular since 2000. Denies LOF. Some mucousy d/c

## 2015-05-11 LAB — CBC
HEMATOCRIT: 30.1 % — AB (ref 36.0–46.0)
HEMOGLOBIN: 10 g/dL — AB (ref 12.0–15.0)
MCH: 31.6 pg (ref 26.0–34.0)
MCHC: 33.2 g/dL (ref 30.0–36.0)
MCV: 95.3 fL (ref 78.0–100.0)
Platelets: 166 10*3/uL (ref 150–400)
RBC: 3.16 MIL/uL — ABNORMAL LOW (ref 3.87–5.11)
RDW: 15.7 % — AB (ref 11.5–15.5)
WBC: 10.8 10*3/uL — ABNORMAL HIGH (ref 4.0–10.5)

## 2015-05-11 LAB — TYPE AND SCREEN
ABO/RH(D): O POS
ANTIBODY SCREEN: NEGATIVE
UNIT DIVISION: 0
UNIT DIVISION: 0

## 2015-05-11 NOTE — Anesthesia Postprocedure Evaluation (Signed)
Anesthesia Post Note  Patient: [name]  Procedure(s) Performed: Procedure(s) (LRB): CESAREAN SECTION (N/A)  Patient location during evaluation: Mother Baby Anesthesia Type: Epidural Level of consciousness: awake and alert, oriented and patient cooperative Pain management: pain level controlled Vital Signs Assessment: post-procedure vital signs reviewed and stable Respiratory status: spontaneous breathing Cardiovascular status: stable Postop Assessment: no headache, epidural receding, patient able to bend at knees and no signs of nausea or vomiting Anesthetic complications: no    Last Vitals:  Filed Vitals:   05/11/15 0133 05/11/15 0556  BP: 104/63 101/62  Pulse: 76 73  Temp: 37.1 C 36.4 C  Resp: 16 18    Last Pain:  Filed Vitals:   05/11/15 0914  PainSc: 3                  Lenzy Kerschner

## 2015-05-11 NOTE — Progress Notes (Signed)
CLINICAL SOCIAL WORK MATERNAL/CHILD NOTE  Patient Details  Name: Girl Kendyl Festa MRN: [id number] Date of Birth: 05/10/2015  Date: 05/11/2015  Clinical Social Worker Initiating Note: Jasmyn Picha, LCSWDate/ Time Initiated: 05/11/15/1100   Child's Name: [place]:  (Parents Eustace Pen and Thurston Hole)   Need for Interpreter: None   Date of Referral: 05/10/15   Reason for Referral: Other (Comment)   Referral Source: [facility]   Address: [place], [place] [address]  Phone number:  [phone])   Household Members: Self, Minor Children   Natural Supports (not living in the home): Immediate Family, Extended Family   Professional Supports:Therapist (Dr Jacelyn Grip)   Employment:Full-time   Type of Work:     Education:     Museum/gallery curator Resources:Medicaid   Other Resources: Physicist, medical , [facility]   Cultural/Religious Considerations Which May Impact Care:none noted  Strengths: Ability to meet basic needs , Home prepared for child , Pediatrician chosen    Risk Factors/Current Problems:  (Hx of PP Depression)   Cognitive State: Alert , Able to Concentrate    Mood/Affect: Happy    CSW Assessment: Acknowledged order for social work consult to assess mother's hx of PP Depression. There was also a question of "the status of other children" Met with mother who was pleasant and receptive to social work. Informed that she suffered from PP Depression after the birth of both her 29 year old. Mother states that the depression was mild and she never sought treatment. Informed that once she returned to work and got back into a routine, the symptoms reside. She denies any current symptoms of depression or anxiety. She denies any hx of illicit drug use. She has 4 other children ages 29, 29, 83, and 1. Informed that she maintains custody of all her children. Grandmother is  reportedly caring for them while she's hospitalized. Mother spoke of how traumatic this delivery was. She had to have an emergency c-section. Allowed her to share her feelings. Validated her feelings and provided supportive feedback. Mother notes that this pregnancy was a surprise. She was not aware of the pregnancy until July. Informed that although the pregnancy was not planned, she is excited about newborn. No acute social concerns noted or reported at this time. Mother informed of social work Fish farm manager.  CSW Plan/Description:    She's aware of signs/symptoms of PP Depression and available resources No further intervention required No barriers to discharge   Amala Petion J, LCSW 05/11/2015, 4:03 PM

## 2015-05-11 NOTE — Lactation Note (Signed)
This note was copied from the chart of Girl Mayra NeerDemekka Kreamer. Lactation Consultation Note  Patient Name: Girl Mayra NeerDemekka Zirbel ZOXWR'UToday's Date: 05/11/2015 Reason for consult: Follow-up assessment   Follow-up consult at 29 hrs old;  GA 39.6; BW 7 lbs, 11.3 oz.  Wt Loss at ~17 hrs old - 4%.  DAT+  Bilirubin assessment from this am in low risk zone.   Mom experienced Bf with last child (29 yr old) for 6 months and she did both breast and formula.  Mom states she plans to do both with this baby. Infant has breastfed x4 (10-15 min) + attempts x1 (3 min) + formula x4 (10 ml) in past 24 hrs; voids-2 in 24 hrs/ 5 life; stools-1 in 24 hrs/ 4 life.  LS-8 by RN. Mom was holding infant in arms when LC entered.   Mom denied needing any assistance with BF and stated "I think I got it."  Told LC her last BF experience is her child who is 331 yo whom she BF/BO Formula for 6 months.   Reviewed supply & demand and milk production.  Encouraged BF first prior to offering formula to increase milk production.   Mom denies any breast or nipple pain.   Mom is supplementing with Alimentum formula.  Encouraged to discuss with Peds which formula Peds recommends her to use at home.   Encouraged to call for assistance as needed.    Consult Status Consult Status: Follow-up Date: 05/12/15 Follow-up type: In-patient    Lendon KaVann, Mcguire Gasparyan Walker 05/11/2015, 8:39 PM

## 2015-05-11 NOTE — Progress Notes (Signed)
Patient ID: [name], female   DOB: June 25, 1985, 29 y.o.   MRN: [id number] Postop day 1 Blood pressure 10162 afebrile pulse 73 respiration 18 Abdomen soft Dressing dry Legs negative Doing well

## 2015-05-11 NOTE — Addendum Note (Signed)
Addendum  created 05/11/15 0933 by Angela Adamana G Yulianna Folse, CRNA   Modules edited: Clinical Notes, Notes Section   Clinical Notes:  File: [id number]   Notes Section:  Delete: [id number]

## 2015-05-12 ENCOUNTER — Encounter (HOSPITAL_COMMUNITY): Payer: Self-pay | Admitting: Obstetrics and Gynecology

## 2015-05-12 ENCOUNTER — Inpatient Hospital Stay (HOSPITAL_COMMUNITY): Admission: RE | Admit: 2015-05-12 | Payer: Medicaid Other | Source: Ambulatory Visit

## 2015-05-12 NOTE — Lactation Note (Signed)
This note was copied from the chart of Natalie Mayra NeerDemekka Wallman. Lactation Consultation Note Called to assist for latching. Baby crying fighting not wanting to take breast. Mom had bottles on bedside table and pacifier in bed. Asked mom how much has she been giving bottle and pacifier. Mom stated a lot. I stuck pacifier in mouth and baby stopped crying. Took pacifier out and put nipple in and baby started crying again, pushing away from breast. Hand expressed colostrum into mouth from nipple and baby still would suckle on breast. Got mom to cuddle with baby and baby stopped crying. Mom stated every time she did put baby to breast she would only suckle a few minutes then she would go to sleep and then wake up 5-10 min. Later. Explained newborn behavior and cluster feeding. Room is very hot and mom has clothes on baby, encouraged STS. Gave mom a vial and asked her to hand express and let baby taste her colostrum to stimulate BF. Encouraged not to give pacifiers and bottles d/t confusion. Asked mom if she wanted to pump, hand or DEBP. Mom stated no she had a pump at home that she would hand express to keep breast stimulated and give collect colostrum.  Patient Name: [name] Reason for consult: Follow-up assessment   Maternal Data    Feeding Feeding Type: Breast Milk Length of feed: 0 min  LATCH Score/Interventions Latch: Too sleepy or reluctant, no latch achieved, no sucking elicited.  Intervention(s): Hand expression;Alternate breast massage  Type of Nipple: Everted at rest and after stimulation  Comfort (Breast/Nipple): Soft / non-tender     Hold (Positioning): Assistance needed to correctly position infant at breast and maintain latch. Intervention(s): Skin to skin;Position options;Support Pillows;Breastfeeding basics reviewed     Lactation Tools Discussed/Used     Consult Status Consult Status: Follow-up Date: 05/12/15 Follow-up type:  In-patient    [name], [name], 2:13 AM

## 2015-05-12 NOTE — Progress Notes (Signed)
Patient ID: [name], female   DOB: 1985-07-26, 29 y.o.   MRN: [id number] Postop day 2 Blood pressure 06/20[birth year]1969 pulse 79 respiration 19 afebrile Fundus firm And this abdomen soft Legs negative doing well

## 2015-05-13 ENCOUNTER — Ambulatory Visit: Payer: Self-pay

## 2015-05-13 NOTE — Lactation Note (Signed)
This note was copied from the chart of Natalie Mayra NeerDemekka Conti. Lactation Consultation Note: Mother declines need for LC . Advised mother in treatment to prevent engorgement with the use of good breast massage and ice. Mother is aware of available LC services and community support.   Patient Name: [name] UJWJX'BToday's Date: 05/13/2015     Maternal Data    Feeding    LATCH Score/Interventions                      Lactation Tools Discussed/Used     Consult Status      Michel BickersKendrick, Thelonious Kauffmann McCoy 05/13/2015, 12:37 PM

## 2015-05-13 NOTE — Discharge Summary (Signed)
Obstetric Discharge Summary Reason for Admission: onset of labor Prenatal Procedures: none Intrapartum Procedures: cesarean: low cervical, transverse Postpartum Procedures: none Complications-Operative and Postpartum: none HEMOGLOBIN  Date Value Ref Range Status  05/11/2015 10.0* 12.0 - 15.0 g/dL Final   HCT  Date Value Ref Range Status  05/11/2015 30.1* 36.0 - 46.0 % Final    Physical Exam:  General: alert Lochia: appropriate Uterine Fundus: firm Incision: healing well DVT Evaluation: No evidence of DVT seen on physical exam.  Discharge Diagnoses: Term Pregnancy-delivered  Discharge Information: Date: 05/13/2015 Activity: pelvic rest Diet: routine Medications: None Condition: improved Instructions: refer to practice specific booklet Discharge to: home Follow-up Information    Follow up with Kathreen CosierMARSHALL,BERNARD A, MD.   Specialty:  Obstetrics and Gynecology   Contact information:   [address] [place] [address] [phone]       Newborn Data: Live born female  Birth Weight: 7 lb 11.3 oz (3496 g) APGAR: 8, 9  Home with mother.  MARSHALL,BERNARD A 05/13/2015, 7:24 AM

## 2015-05-13 NOTE — Discharge Instructions (Signed)
Discharge instructions   You can wash your hair  Shower  Eat what you want  Drink what you want  See me in 6 weeks  Your ankles are going to swell more in the next 2 weeks than when pregnant  No sex for 6 weeks   Royce Stegman A, MD 05/13/2015

## 2015-05-13 NOTE — Progress Notes (Signed)
Patient ID: [name], female   DOB: Nov 28, 1985, 29 y.o.   MRN: [id number] Postop day 3 Blood pressure 108/73 respiration 15 pulse 80 Abdomen soft incision clean and dry Legs negative doing well home today

## 2016-04-06 ENCOUNTER — Encounter (HOSPITAL_COMMUNITY): Payer: Self-pay | Admitting: *Deleted

## 2016-04-06 ENCOUNTER — Ambulatory Visit (HOSPITAL_COMMUNITY)
Admission: EM | Admit: 2016-04-06 | Discharge: 2016-04-06 | Disposition: A | Discharge status: Home / Self Care | Payer: Managed Care, Other (non HMO) | Attending: Family Medicine | Admitting: Family Medicine

## 2016-04-06 DIAGNOSIS — B9689 Other specified bacterial agents as the cause of diseases classified elsewhere: Secondary | ICD-10-CM

## 2016-04-06 DIAGNOSIS — R109 Unspecified abdominal pain: Secondary | ICD-10-CM | POA: Diagnosis present

## 2016-04-06 DIAGNOSIS — N760 Acute vaginitis: Secondary | ICD-10-CM | POA: Insufficient documentation

## 2016-04-06 MED ORDER — METRONIDAZOLE 0.75 % VA GEL: 1.0000 | Freq: Every day | VAGINAL | 1 refills | Status: DC

## 2016-04-06 NOTE — ED Provider Notes (Signed)
[facility]    CSN: [id number] Arrival date & time: 04/06/16  1046     History   Chief Complaint Chief Complaint  Patient presents with  . Vaginal Discharge  . Abdominal Pain    HPI [name] is a 30 y.o. female.   The history is provided by the patient.  Vaginal Discharge  Quality:  Malodorous, white and yellow Severity:  Mild Onset quality:  Gradual Duration:  1 week Chronicity:  New Context: after intercourse   Ineffective treatments:  None tried Associated symptoms: abdominal pain   Associated symptoms: no dysuria and no fever   Risk factors: no new sexual partner   Abdominal Pain  Associated symptoms: vaginal discharge   Associated symptoms: no dysuria and no fever     Past Medical History:  Diagnosis Date  . Anemia   . Depression    PP no meds  . Hx of gonorrhea     Patient Active Problem List   Diagnosis Date Noted  . Normal labor and delivery 05/10/2015  . S/P cesarean section 05/10/2015  . Normal delivery 12/22/2013  . Active labor 12/21/2013  . Echogenic intracardiac focus of fetus on prenatal ultrasound 08/25/2013  . Unspecified vitamin D deficiency 07/26/2013  . Supervision of other normal pregnancy 07/20/2013  . Vaginitis and vulvovaginitis, unspecified 11/08/2012  . ABDOMINAL CRAMPS 07/02/2010  . CANDIDIASIS 01/01/2010  . VAGINAL BLEEDING 07/30/2009  . FATIGUE 07/30/2009  . CONJUNCTIVAL HEMORRHAGE 04/11/2008  . GONORRHEA 11/15/2007    Past Surgical History:  Procedure Laterality Date  . CESAREAN SECTION N/A 05/10/2015   Procedure: CESAREAN SECTION;  Surgeon: Tilda BurrowJohn Ferguson V, MD;  Location: [facility];  Service: Obstetrics;  Laterality: N/A;  . HERNIA REPAIR     2003  . VAGINAL DELIVERY     x 3    OB History    Gravida Para Term Preterm AB Living   7 5 5   2 5    SAB TAB Ectopic Multiple Live Births         0 5       Home Medications    Prior to Admission medications   Not on File    Family  History Family History  Problem Relation Age of Onset  . Hypertension Father   . Hypertension Maternal Grandmother   . Cancer Neg Hx   . Diabetes Neg Hx   . Heart disease Neg Hx     Social History Social History  Substance Use Topics  . Smoking status: Never Smoker  . Smokeless tobacco: Never Used  . Alcohol use No     Allergies   Amoxicillin and Ibuprofen   Review of Systems Review of Systems  Constitutional: Negative.  Negative for fever.  Gastrointestinal: Positive for abdominal pain.  Genitourinary: Positive for vaginal discharge. Negative for dysuria, frequency, menstrual problem, pelvic pain and urgency.  All other systems reviewed and are negative.    Physical Exam Triage Vital Signs ED Triage Vitals [04/06/16 1059]  Enc Vitals Group     BP 121/90     Pulse Rate 85     Resp 17     Temp      Temp src      SpO2 100 %     Weight      Height      Head Circumference      Peak Flow      Pain Score      Pain Loc  Pain Edu?      Excl. in GC?    No data found.   Updated Vital Signs BP 121/90 (BP Location: Left Arm)   Pulse 85   Resp 17   LMP 03/31/2016   SpO2 100%   Visual Acuity Right Eye Distance:   Left Eye Distance:   Bilateral Distance:    Right Eye Near:   Left Eye Near:    Bilateral Near:     Physical Exam  Constitutional: She appears well-developed and well-nourished.  Abdominal: Soft. Bowel sounds are normal. She exhibits no distension and no mass. There is no tenderness. There is no rebound and no guarding. No hernia.  Skin: Skin is warm.  Nursing note and vitals reviewed.    UC Treatments / Results  Labs (all labs ordered are listed, but only abnormal results are displayed) Labs Reviewed - No data to display  EKG  EKG Interpretation None       Radiology No results found.  Procedures Procedures (including critical care time)  Medications Ordered in UC Medications - No data to display   Initial Impression /  Assessment and Plan / UC Course  I have reviewed the triage vital signs and the nursing notes.  Pertinent labs & imaging results that were available during my care of the patient were reviewed by me and considered in my medical decision making (see chart for details).  Clinical Course      Final Clinical Impressions(s) / UC Diagnoses   Final diagnoses:  None    New Prescriptions New Prescriptions   No medications on file     Linna HoffJames D Macyn Remmert, MD 04/06/16 1132

## 2016-04-06 NOTE — ED Triage Notes (Signed)
Patient reports for one week she has had lower abomdinal pain and vaginal discharge. Patient reports history of vaginal bacteria infections. Patient does have one partner but states that she would like to get tested for STDs.

## 2016-04-06 NOTE — ED Notes (Signed)
Phone  Number  Verified     -       Plan of  Care   -     Discussed

## 2016-04-07 LAB — URINE CYTOLOGY ANCILLARY ONLY
CHLAMYDIA, DNA PROBE: NEGATIVE
Neisseria Gonorrhea: NEGATIVE
Trichomonas: NEGATIVE

## 2016-04-09 LAB — URINE CYTOLOGY ANCILLARY ONLY

## 2016-04-12 ENCOUNTER — Telehealth (HOSPITAL_COMMUNITY): Payer: Self-pay | Admitting: Emergency Medicine

## 2016-04-12 NOTE — Telephone Encounter (Signed)
See lab note from 04/06/16  Called pt and notified of recent lab results from visit 04/06/16 Pt ID'd properly... Reports feeling better and sx have subsided Taking and tolerating well meds.  Adv pt if sx are not getting better to return or to f/u w/PCP Pt verb understanding.

## 2018-07-27 ENCOUNTER — Encounter (HOSPITAL_COMMUNITY): Payer: Self-pay

## 2018-07-27 ENCOUNTER — Ambulatory Visit (HOSPITAL_COMMUNITY)
Admission: EM | Admit: 2018-07-27 | Discharge: 2018-07-27 | Disposition: A | Discharge status: Home / Self Care | Payer: Medicaid Other | Attending: Family Medicine | Admitting: Family Medicine

## 2018-07-27 DIAGNOSIS — N760 Acute vaginitis: Secondary | ICD-10-CM | POA: Insufficient documentation

## 2018-07-27 MED ORDER — METRONIDAZOLE 500 MG PO TABS: 500.0000 mg | ORAL_TABLET | Freq: Two times a day (BID) | ORAL | 0 refills | Status: DC

## 2018-07-27 NOTE — ED Provider Notes (Signed)
[facility]    CSN: [id number] Arrival date & time: 07/27/18  4982     History   Chief Complaint Chief Complaint  Patient presents with  . BV    HPI [name] is a 33 y.o. female.   Patient is a 33 year old female that presents with vaginal discharge, odor.  This started approximately 2 weeks ago after using a bath bomb. Symptoms have been waxing and waning.   She has been using boric acid suppositories with minimal relief of symptoms. The symptoms were gone for a few days and then returned.  She does have a hx of BV. She is currently sexually active with her husband and not concerned for STDs. Patient's last menstrual period was 07/17/2018.  She uses tubal for birth control.  No abdominal pain, back pain, pelvic pain, fevers, chills, night sweats, dysuria, hematuria, urinary frequency.  ROS per HPI      Past Medical History:  Diagnosis Date  . Anemia   . Depression    PP no meds  . Hx of gonorrhea     Patient Active Problem List   Diagnosis Date Noted  . Normal labor and delivery 05/10/2015  . S/P cesarean section 05/10/2015  . Normal delivery 12/22/2013  . Active labor 12/21/2013  . Echogenic intracardiac focus of fetus on prenatal ultrasound 08/25/2013  . Unspecified vitamin D deficiency 07/26/2013  . Supervision of other normal pregnancy 07/20/2013  . Vaginitis and vulvovaginitis, unspecified 11/08/2012  . ABDOMINAL CRAMPS 07/02/2010  . CANDIDIASIS 01/01/2010  . VAGINAL BLEEDING 07/30/2009  . FATIGUE 07/30/2009  . CONJUNCTIVAL HEMORRHAGE 04/11/2008  . GONORRHEA 11/15/2007    Past Surgical History:  Procedure Laterality Date  . CESAREAN SECTION N/A 05/10/2015   Procedure: CESAREAN SECTION;  Surgeon: Tilda Burrow, MD;  Location: [facility];  Service: Obstetrics;  Laterality: N/A;  . HERNIA REPAIR     2003  . VAGINAL DELIVERY     x 3    OB History    Gravida  7   Para  5   Term  5   Preterm      AB  2   Living  5     SAB        TAB      Ectopic      Multiple  0   Live Births  5            Home Medications    Prior to Admission medications   Medication Sig Start Date End Date Taking? Authorizing Provider  metroNIDAZOLE (FLAGYL) 500 MG tablet Take 1 tablet (500 mg total) by mouth 2 (two) times daily. 07/27/18   Dahlia Byes A, NP  metroNIDAZOLE (METROGEL VAGINAL) 0.75 % vaginal gel Place 1 Applicatorful vaginally at bedtime. For 5-7 nights 04/06/16   Linna Hoff, MD    Family History Family History  Problem Relation Age of Onset  . Hypertension Father   . Hypertension Maternal Grandmother   . Cancer Neg Hx   . Diabetes Neg Hx   . Heart disease Neg Hx     Social History Social History   Tobacco Use  . Smoking status: Never Smoker  . Smokeless tobacco: Never Used  Substance Use Topics  . Alcohol use: No  . Drug use: No     Allergies   Amoxicillin and Ibuprofen   Review of Systems Review of Systems   Physical Exam Triage Vital Signs ED Triage Vitals  Enc Vitals Group  BP 07/27/18 0924 (!) 141/91     Pulse Rate 07/27/18 0924 86     Resp 07/27/18 0924 18     Temp 07/27/18 0929 98.9 F (37.2 C)     Temp Source 07/27/18 0929 Oral     SpO2 07/27/18 0924 100 %     Weight --      Height --      Head Circumference --      Peak Flow --      Pain Score 07/27/18 0926 0     Pain Loc --      Pain Edu? --      Excl. in GC? --    No data found.  Updated Vital Signs BP (!) 141/91 (BP Location: Left Arm)   Pulse 86   Temp 98.9 F (37.2 C) (Oral)   Resp 18   LMP 07/17/2018   SpO2 100%   Visual Acuity Right Eye Distance:   Left Eye Distance:   Bilateral Distance:    Right Eye Near:   Left Eye Near:    Bilateral Near:     Physical Exam Vitals signs and nursing note reviewed.  Constitutional:      General: She is not in acute distress.    Appearance: Normal appearance. She is well-developed. She is not ill-appearing.  HENT:     Head: Normocephalic and  atraumatic.  Neck:     Musculoskeletal: Normal range of motion and neck supple.  Pulmonary:     Effort: Pulmonary effort is normal.  Abdominal:     Palpations: Abdomen is soft.     Tenderness: There is no abdominal tenderness.  Genitourinary:    Comments: Deferred Skin:    General: Skin is warm and dry.  Neurological:     Mental Status: She is alert.  Psychiatric:        Mood and Affect: Mood normal.      UC Treatments / Results  Labs (all labs ordered are listed, but only abnormal results are displayed) Labs Reviewed  CERVICOVAGINAL ANCILLARY ONLY    EKG None  Radiology No results found.  Procedures Procedures (including critical care time)  Medications Ordered in UC Medications - No data to display  Initial Impression / Assessment and Plan / UC Course  I have reviewed the triage vital signs and the nursing notes.  Pertinent labs & imaging results that were available during my care of the patient were reviewed by me and considered in my medical decision making (see chart for details).     We will go ahead and treat for BV based on history and symptoms Flagyl twice a day for 7 days Sending a self swab for further testing Labs pending Final Clinical Impressions(s) / UC Diagnoses   Final diagnoses:  Vaginitis and vulvovaginitis     Discharge Instructions     We will go ahead and treat you for BV today based on your history and symptoms We will send your swab for further testing and call you with any positive results    ED Prescriptions    Medication Sig Dispense Auth. Provider   metroNIDAZOLE (FLAGYL) 500 MG tablet Take 1 tablet (500 mg total) by mouth 2 (two) times daily. 14 tablet Dahlia Byes A, NP     Controlled Substance Prescriptions [place] Controlled Substance Registry consulted? Not Applicable   Janace Aris, NP 07/27/18 1109

## 2018-07-27 NOTE — ED Triage Notes (Signed)
Pt presents with symptoms of bacterial vaginosis after using a bath bomb 2 weeks ago; pt states she has used boric acid suppositories but that only give temporary relief.

## 2018-07-27 NOTE — Discharge Instructions (Signed)
We will go ahead and treat you for BV today based on your history and symptoms We will send your swab for further testing and call you with any positive results

## 2018-07-28 LAB — CERVICOVAGINAL ANCILLARY ONLY
Bacterial vaginitis: NEGATIVE
CANDIDA VAGINITIS: NEGATIVE
Chlamydia: NEGATIVE
Neisseria Gonorrhea: NEGATIVE
TRICH (WINDOWPATH): NEGATIVE

## 2019-03-13 ENCOUNTER — Other Ambulatory Visit (HOSPITAL_COMMUNITY)
Admission: RE | Admit: 2019-03-13 | Discharge: 2019-03-13 | Disposition: A | Discharge status: Home / Self Care | Payer: Medicaid Other | Source: Ambulatory Visit | Attending: Obstetrics and Gynecology | Admitting: Obstetrics and Gynecology

## 2019-03-13 ENCOUNTER — Encounter: Payer: Self-pay | Admitting: Obstetrics and Gynecology

## 2019-03-13 ENCOUNTER — Other Ambulatory Visit: Payer: Self-pay

## 2019-03-13 ENCOUNTER — Ambulatory Visit: Payer: Medicaid Other | Admitting: Obstetrics and Gynecology

## 2019-03-13 DIAGNOSIS — Z01419 Encounter for gynecological examination (general) (routine) without abnormal findings: Secondary | ICD-10-CM | POA: Insufficient documentation

## 2019-03-13 DIAGNOSIS — Z0000 Encounter for general adult medical examination without abnormal findings: Secondary | ICD-10-CM | POA: Diagnosis not present

## 2019-03-13 DIAGNOSIS — Z202 Contact with and (suspected) exposure to infections with a predominantly sexual mode of transmission: Secondary | ICD-10-CM | POA: Diagnosis not present

## 2019-03-13 NOTE — Progress Notes (Signed)
New GYN, presents for AEX/PAP/STD Checks.   Declined FLU vaccine.  Tubal done 2016.

## 2019-03-13 NOTE — Patient Instructions (Signed)
Health Maintenance, Female Adopting a healthy lifestyle and getting preventive care are important in promoting health and wellness. Ask your health care provider about:  The right schedule for you to have regular tests and exams.  Things you can do on your own to prevent diseases and keep yourself healthy. What should I know about diet, weight, and exercise? Eat a healthy diet   Eat a diet that includes plenty of vegetables, fruits, low-fat dairy products, and lean protein.  Do not eat a lot of foods that are high in solid fats, added sugars, or sodium. Maintain a healthy weight Body mass index (BMI) is used to identify weight problems. It estimates body fat based on height and weight. Your health care provider can help determine your BMI and help you achieve or maintain a healthy weight. Get regular exercise Get regular exercise. This is one of the most important things you can do for your health. Most adults should:  Exercise for at least 150 minutes each week. The exercise should increase your heart rate and make you sweat (moderate-intensity exercise).  Do strengthening exercises at least twice a week. This is in addition to the moderate-intensity exercise.  Spend less time sitting. Even light physical activity can be beneficial. Watch cholesterol and blood lipids Have your blood tested for lipids and cholesterol at 33 years of age, then have this test every 5 years. Have your cholesterol levels checked more often if:  Your lipid or cholesterol levels are high.  You are older than 33 years of age.  You are at high risk for heart disease. What should I know about cancer screening? Depending on your health history and family history, you may need to have cancer screening at various ages. This may include screening for:  Breast cancer.  Cervical cancer.  Colorectal cancer.  Skin cancer.  Lung cancer. What should I know about heart disease, diabetes, and high blood  pressure? Blood pressure and heart disease  High blood pressure causes heart disease and increases the risk of stroke. This is more likely to develop in people who have high blood pressure readings, are of African descent, or are overweight.  Have your blood pressure checked: ? Every 3-5 years if you are 18-39 years of age. ? Every year if you are 40 years old or older. Diabetes Have regular diabetes screenings. This checks your fasting blood sugar level. Have the screening done:  Once every three years after age 40 if you are at a normal weight and have a low risk for diabetes.  More often and at a younger age if you are overweight or have a high risk for diabetes. What should I know about preventing infection? Hepatitis B If you have a higher risk for hepatitis B, you should be screened for this virus. Talk with your health care provider to find out if you are at risk for hepatitis B infection. Hepatitis C Testing is recommended for:  Everyone born from 1945 through 1965.  Anyone with known risk factors for hepatitis C. Sexually transmitted infections (STIs)  Get screened for STIs, including gonorrhea and chlamydia, if: ? You are sexually active and are younger than 33 years of age. ? You are older than 33 years of age and your health care provider tells you that you are at risk for this type of infection. ? Your sexual activity has changed since you were last screened, and you are at increased risk for chlamydia or gonorrhea. Ask your health care provider if   you are at risk.  Ask your health care provider about whether you are at high risk for HIV. Your health care provider may recommend a prescription medicine to help prevent HIV infection. If you choose to take medicine to prevent HIV, you should first get tested for HIV. You should then be tested every 3 months for as long as you are taking the medicine. Pregnancy  If you are about to stop having your period (premenopausal) and  you may become pregnant, seek counseling before you get pregnant.  Take 400 to 800 micrograms (mcg) of folic acid every day if you become pregnant.  Ask for birth control (contraception) if you want to prevent pregnancy. Osteoporosis and menopause Osteoporosis is a disease in which the bones lose minerals and strength with aging. This can result in bone fractures. If you are 65 years old or older, or if you are at risk for osteoporosis and fractures, ask your health care provider if you should:  Be screened for bone loss.  Take a calcium or vitamin D supplement to lower your risk of fractures.  Be given hormone replacement therapy (HRT) to treat symptoms of menopause. Follow these instructions at home: Lifestyle  Do not use any products that contain nicotine or tobacco, such as cigarettes, e-cigarettes, and chewing tobacco. If you need help quitting, ask your health care provider.  Do not use street drugs.  Do not share needles.  Ask your health care provider for help if you need support or information about quitting drugs. Alcohol use  Do not drink alcohol if: ? Your health care provider tells you not to drink. ? You are pregnant, may be pregnant, or are planning to become pregnant.  If you drink alcohol: ? Limit how much you use to 0-1 drink a day. ? Limit intake if you are breastfeeding.  Be aware of how much alcohol is in your drink. In the [place] one drink equals one 12 oz bottle of beer (355 mL), one 5 oz glass of wine (148 mL), or one 1 oz glass of hard liquor (44 mL). General instructions  Schedule regular health, dental, and eye exams.  Stay current with your vaccines.  Tell your health care provider if: ? You often feel depressed. ? You have ever been abused or do not feel safe at home. Summary  Adopting a healthy lifestyle and getting preventive care are important in promoting health and wellness.  Follow your health care provider's instructions about healthy  diet, exercising, and getting tested or screened for diseases.  Follow your health care provider's instructions on monitoring your cholesterol and blood pressure. This information is not intended to replace advice given to you by your health care provider. Make sure you discuss any questions you have with your health care provider. Document Released: 12/07/2010 Document Revised: 05/17/2018 Document Reviewed: 05/17/2018 [employer]vier Patient Education  2020 [employer]  

## 2019-03-13 NOTE — Progress Notes (Signed)
Patient ID: [name], female   DOB: Oct 08, 1985, 33 y.o.   MRN: [id number]  [name] is a 33 y.o. [id number] female here for a routine annual gynecologic exam. She has no GYN complaints.    Denies abnormal vaginal bleeding, discharge, pelvic pain, problems with intercourse or other gynecologic concerns.    Gynecologic History Patient's last menstrual period was 03/09/2019 (exact date). Contraception: tubal ligation Last Pap: 2016. Results were: normal Last mammogram: NA.   Obstetric History OB History  Gravida Para Term Preterm AB Living  7 5 5   2 5   SAB TAB Ectopic Multiple Live Births        0 5    # Outcome Date GA Lbr Len/2nd Weight Sex Delivery Anes PTL Lv  7 Term 05/10/15 [redacted]w[redacted]d  7 lb 11.3 oz (3.496 kg) F CS-LTranv EPI  LIV  6 Term 12/22/13 [redacted]w[redacted]d -07:24 / 00:10 7 lb 0.4 oz (3.187 kg) M Vag-Spont EPI  LIV  5 AB 2010          4 Term 12/16/06 [redacted]w[redacted]d  8 lb 2 oz (3.685 kg) F Vag-Spont EPI N LIV  3 AB 04/07/05          2 Term 11/06/04 [redacted]w[redacted]d  7 lb 14 oz (3.572 kg) M Vag-Spont None N LIV  1 Term 07/19/02 [redacted]w[redacted]d  7 lb 4 oz (3.289 kg) M Vag-Spont EPI N LIV    Past Medical History:  Diagnosis Date  . Anemia   . Blood transfusion without reported diagnosis   . Conjunctival hemorrhage 04/11/2008   Qualifier: Diagnosis of  By: 13/10/2007 FNP, Daphine Deutscher    . Depression    PP no meds  . FATIGUE 07/30/2009   Qualifier: Diagnosis of  By: 08/01/2009 FNP, Daphine Deutscher    . GONORRHEA 11/15/2007   Qualifier: Diagnosis of  By: 01/15/2008 CMA, Tiffany    . Hx of gonorrhea   . Unspecified vitamin D deficiency 07/26/2013    Past Surgical History:  Procedure Laterality Date  . CESAREAN SECTION N/A 05/10/2015   Procedure: CESAREAN SECTION;  Surgeon: 14/08/2014, MD;  Location: [facility];  Service: Obstetrics;  Laterality: N/A;  . HERNIA REPAIR     2003  . TUBAL LIGATION    . VAGINAL DELIVERY     x 3    Current Outpatient Medications on File Prior to Visit  Medication Sig Dispense Refill  .  ferrous sulfate 325 (65 FE) MG tablet Take 325 mg by mouth daily with breakfast.    . Multiple Vitamins-Minerals (MULTI-VITAMIN GUMMIES PO) Take by mouth.     No current facility-administered medications on file prior to visit.     Allergies  Allergen Reactions  . Amoxicillin Anaphylaxis and Hives    Has patient had a PCN reaction causing immediate rash, facial/tongue/throat swelling, SOB or lightheadedness with hypotension: Yes Has patient had a PCN reaction causing severe rash involving mucus membranes or skin necrosis: No Has patient had a PCN reaction that required hospitalization No Has patient had a PCN reaction occurring within the last 10 years: Yes If all of the above answers are "NO", then may proceed with Cephalosporin use.  . Ibuprofen Anaphylaxis and Hives    Social History   Socioeconomic History  . Marital status: Single    Spouse name: Not on file  . Number of children: 5  . Years of education: Not on file  . Highest education level: Not on file  Occupational History  . Occupation: Med  Secondary school teacher: [employer]  . Occupation: [employer]  Social Needs  . Financial resource strain: Not on file  . Food insecurity    Worry: Not on file    Inability: Not on file  . Transportation needs    Medical: Not on file    Non-medical: Not on file  Tobacco Use  . Smoking status: Never Smoker  . Smokeless tobacco: Never Used  Substance and Sexual Activity  . Alcohol use: No  . Drug use: No  . Sexual activity: Yes    Partners: Male    Birth control/protection: Surgical  Lifestyle  . Physical activity    Days per week: Not on file    Minutes per session: Not on file  . Stress: Not on file  Relationships  . Social Herbalist on phone: Not on file    Gets together: Not on file    Attends religious service: Not on file    Active member of club or organization: Not on file    Attends meetings of clubs or organizations: Not on file     Relationship status: Not on file  . Intimate partner violence    Fear of current or ex partner: Not on file    Emotionally abused: Not on file    Physically abused: Not on file    Forced sexual activity: Not on file  Other Topics Concern  . Not on file  Social History Narrative  . Not on file    Family History  Problem Relation Age of Onset  . Hypertension Father   . Hypertension Maternal Grandmother   . Cancer Neg Hx   . Diabetes Neg Hx   . Heart disease Neg Hx     The following portions of the patient's history were reviewed and updated as appropriate: allergies, current medications, past family history, past medical history, past social history, past surgical history and problem list.  Review of Systems Pertinent items noted in HPI and remainder of comprehensive ROS otherwise negative.   Objective:  BP 118/76   Pulse 78   Temp 98.1 F (36.7 C)   Ht 5\' 2"  (1.575 m)   Wt 117 lb 12.8 oz (53.4 kg)   LMP 03/09/2019 (Exact Date)   BMI 21.55 kg/m  CONSTITUTIONAL: Well-developed, well-nourished female in no acute distress.  HENT:  Normocephalic, atraumatic, External right and left ear normal. Oropharynx is clear and moist EYES: Conjunctivae and EOM are normal. Pupils are equal, round, and reactive to light. No scleral icterus.  NECK: Normal range of motion, supple, no masses.  Normal thyroid.  SKIN: Skin is warm and dry. No rash noted. Not diaphoretic. No erythema. No pallor. [place]: Alert and oriented to person, place, and time. Normal reflexes, muscle tone coordination. No cranial nerve deficit noted. PSYCHIATRIC: Normal mood and affect. Normal behavior. Normal judgment and thought content. CARDIOVASCULAR: Normal heart rate noted, regular rhythm RESPIRATORY: Clear to auscultation bilaterally. Effort and breath sounds normal, no problems with respiration noted. BREASTS: Symmetric in size. No masses, skin changes, nipple drainage, or lymphadenopathy. ABDOMEN: Soft, normal  bowel sounds, no distention noted.  No tenderness, rebound or guarding.  PELVIC: Normal appearing external genitalia; normal appearing vaginal mucosa and cervix.  No abnormal discharge noted.  Pap smear obtained.  Normal uterine size, no other palpable masses, no uterine or adnexal tenderness. MUSCULOSKELETAL: Normal range of motion. No tenderness.  No cyanosis, clubbing, or edema.  2+ distal pulses.   Assessment:  Annual gynecologic examination with pap smear STD exposure Plan:  Will follow up results of pap smear and STD testing,  and manage accordingly.  Routine preventative health maintenance measures emphasized. Please refer to After Visit Summary for other counseling recommendations.    Hermina StaggersMichael L Paula Busenbark, MD, FACOG Attending Obstetrician & Gynecologist [facility]

## 2019-03-14 LAB — HIV ANTIBODY (ROUTINE TESTING W REFLEX): HIV Screen 4th Generation wRfx: NONREACTIVE

## 2019-03-14 LAB — RPR: RPR Ser Ql: NONREACTIVE

## 2019-03-14 LAB — HEPATITIS B CORE ANTIBODY, TOTAL: Hep B Core Total Ab: NEGATIVE

## 2019-03-14 LAB — HEPATITIS C ANTIBODY: Hep C Virus Ab: <0.1 s/co ratio (ref 0.0–0.9)

## 2019-03-20 LAB — CYTOLOGY - PAP
Diagnosis: NEGATIVE
High risk HPV: NEGATIVE

## 2019-03-22 LAB — CERVICOVAGINAL ANCILLARY ONLY
Bacterial Vaginitis (gardnerella): POSITIVE — AB
Candida Glabrata: NEGATIVE
Candida Vaginitis: NEGATIVE
Chlamydia: NEGATIVE
Comment: NEGATIVE
Comment: NEGATIVE
Comment: NEGATIVE
Comment: NEGATIVE
Neisseria Gonorrhea: NEGATIVE
Trichomonas: NEGATIVE

## 2019-03-26 ENCOUNTER — Other Ambulatory Visit: Payer: Self-pay

## 2019-03-26 DIAGNOSIS — B9689 Other specified bacterial agents as the cause of diseases classified elsewhere: Secondary | ICD-10-CM

## 2019-03-26 MED ORDER — METRONIDAZOLE 0.75 % VA GEL: 1.0000 | Freq: Every day | VAGINAL | 1 refills | Status: DC

## 2019-03-26 NOTE — Progress Notes (Signed)
Metrogel for BV sent to pts pharmacy on file.

## 2020-03-14 DIAGNOSIS — Z713 Dietary counseling and surveillance: Secondary | ICD-10-CM | POA: Diagnosis not present

## 2020-03-14 DIAGNOSIS — Z23 Encounter for immunization: Secondary | ICD-10-CM | POA: Diagnosis not present

## 2020-03-14 DIAGNOSIS — Z0000 Encounter for general adult medical examination without abnormal findings: Secondary | ICD-10-CM | POA: Diagnosis not present

## 2020-03-14 DIAGNOSIS — Z20822 Contact with and (suspected) exposure to covid-19: Secondary | ICD-10-CM | POA: Diagnosis not present

## 2020-03-14 DIAGNOSIS — Z03818 Encounter for observation for suspected exposure to other biological agents ruled out: Secondary | ICD-10-CM | POA: Diagnosis not present

## 2020-03-14 DIAGNOSIS — Z111 Encounter for screening for respiratory tuberculosis: Secondary | ICD-10-CM | POA: Diagnosis not present

## 2020-03-21 DIAGNOSIS — Z111 Encounter for screening for respiratory tuberculosis: Secondary | ICD-10-CM | POA: Diagnosis not present

## 2020-08-14 ENCOUNTER — Other Ambulatory Visit: Payer: Self-pay

## 2020-08-14 ENCOUNTER — Ambulatory Visit (INDEPENDENT_AMBULATORY_CARE_PROVIDER_SITE_OTHER): Payer: Medicaid Other

## 2020-08-14 ENCOUNTER — Other Ambulatory Visit (HOSPITAL_COMMUNITY)
Admission: RE | Admit: 2020-08-14 | Discharge: 2020-08-14 | Disposition: A | Discharge status: Home / Self Care | Payer: Medicaid Other | Source: Ambulatory Visit | Attending: Obstetrics | Admitting: Obstetrics

## 2020-08-14 VITALS — BP 133/82 | HR 75 | Wt 114.0 lb

## 2020-08-14 DIAGNOSIS — N760 Acute vaginitis: Secondary | ICD-10-CM | POA: Diagnosis not present

## 2020-08-14 DIAGNOSIS — N898 Other specified noninflammatory disorders of vagina: Secondary | ICD-10-CM

## 2020-08-14 LAB — POCT URINALYSIS DIPSTICK
Bilirubin, UA: NEGATIVE
Blood, UA: NEGATIVE
Glucose, UA: NEGATIVE
Ketones, UA: NEGATIVE
Nitrite, UA: NEGATIVE
Protein, UA: NEGATIVE
Spec Grav, UA: >=1.03 — AB (ref 1.010–1.025)
Urobilinogen, UA: 0.2 E.U./dL
pH, UA: 5 (ref 5.0–8.0)

## 2020-08-14 NOTE — Progress Notes (Addendum)
SUBJECTIVE:  35 y.o. female complains of white vaginal discharge for 5 day(s). Denies abnormal vaginal bleeding or significant pelvic pain or fever. No UTI symptoms. Denies history of known exposure to STD.  No LMP recorded.  OBJECTIVE:  She appears well, afebrile. Urine dipstick: positive for leukocytes.  ASSESSMENT:  Vaginal Discharge     PLAN:  GC, chlamydia, trichomonas, BVAG, CVAG probe, urine culture sent to lab.  Treatment: To be determined once lab results are received ROV prn if symptoms persist or worsen.  Patient was assessed and managed by nursing staff during this encounter. I have reviewed the chart and agree with the documentation and plan. I have also made any necessary editorial changes.  Coral Ceo, MD 08/14/2020 3:00 PM

## 2020-08-15 LAB — CERVICOVAGINAL ANCILLARY ONLY
Bacterial Vaginitis (gardnerella): NEGATIVE
Candida Glabrata: NEGATIVE
Candida Vaginitis: POSITIVE — AB
Chlamydia: NEGATIVE
Comment: NEGATIVE
Comment: NEGATIVE
Comment: NEGATIVE
Comment: NEGATIVE
Comment: NEGATIVE
Comment: NORMAL
Neisseria Gonorrhea: NEGATIVE
Trichomonas: NEGATIVE

## 2020-08-16 LAB — URINE CULTURE: Organism ID, Bacteria: NO GROWTH

## 2020-08-19 ENCOUNTER — Other Ambulatory Visit: Payer: Self-pay | Admitting: Obstetrics

## 2020-08-19 ENCOUNTER — Telehealth: Payer: Self-pay

## 2020-08-19 DIAGNOSIS — B373 Candidiasis of vulva and vagina: Secondary | ICD-10-CM

## 2020-08-19 DIAGNOSIS — B3731 Acute candidiasis of vulva and vagina: Secondary | ICD-10-CM

## 2020-08-19 MED ORDER — FLUCONAZOLE 150 MG PO TABS: 150.0000 mg | ORAL_TABLET | Freq: Once | ORAL | 0 refills | Status: AC

## 2020-08-19 NOTE — Telephone Encounter (Signed)
Patient tested positive for yeast infection. Diflucan will be sent to her pharmacy.

## 2020-08-19 NOTE — Progress Notes (Signed)
diflucan 

## 2020-09-30 DIAGNOSIS — F419 Anxiety disorder, unspecified: Secondary | ICD-10-CM | POA: Diagnosis not present

## 2020-09-30 DIAGNOSIS — F32A Depression, unspecified: Secondary | ICD-10-CM | POA: Diagnosis not present

## 2020-11-19 DIAGNOSIS — F319 Bipolar disorder, unspecified: Secondary | ICD-10-CM | POA: Diagnosis not present

## 2020-11-19 DIAGNOSIS — F4310 Post-traumatic stress disorder, unspecified: Secondary | ICD-10-CM | POA: Diagnosis not present

## 2020-11-19 DIAGNOSIS — F411 Generalized anxiety disorder: Secondary | ICD-10-CM | POA: Diagnosis not present

## 2021-02-04 DIAGNOSIS — F411 Generalized anxiety disorder: Secondary | ICD-10-CM | POA: Diagnosis not present

## 2021-02-04 DIAGNOSIS — F4310 Post-traumatic stress disorder, unspecified: Secondary | ICD-10-CM | POA: Diagnosis not present

## 2021-02-04 DIAGNOSIS — F319 Bipolar disorder, unspecified: Secondary | ICD-10-CM | POA: Diagnosis not present

## 2021-07-04 ENCOUNTER — Encounter (HOSPITAL_COMMUNITY): Payer: Self-pay | Admitting: Emergency Medicine

## 2021-07-04 ENCOUNTER — Other Ambulatory Visit: Payer: Self-pay

## 2021-07-04 ENCOUNTER — Ambulatory Visit (HOSPITAL_COMMUNITY)
Admission: EM | Admit: 2021-07-04 | Discharge: 2021-07-04 | Disposition: A | Discharge status: Home / Self Care | Payer: Medicaid Other | Attending: Physician Assistant | Admitting: Physician Assistant

## 2021-07-04 DIAGNOSIS — K047 Periapical abscess without sinus: Secondary | ICD-10-CM

## 2021-07-04 DIAGNOSIS — K0889 Other specified disorders of teeth and supporting structures: Secondary | ICD-10-CM

## 2021-07-04 MED ORDER — CLINDAMYCIN HCL 300 MG PO CAPS: 300.0000 mg | ORAL_CAPSULE | Freq: Three times a day (TID) | ORAL | 0 refills | Status: DC

## 2021-07-04 MED ORDER — HYDROCODONE-ACETAMINOPHEN 5-325 MG PO TABS: 1.0000 | ORAL_TABLET | Freq: Every day | ORAL | 0 refills | Status: AC

## 2021-07-04 NOTE — ED Triage Notes (Addendum)
Tuesday had a root canal done and each day after, pain has worsened.  Patient called Friday, but not open.  Root canal was performed on tooth #8

## 2021-07-04 NOTE — Discharge Instructions (Addendum)
Start clindamycin 3 times a day to cover for infection.  Use hydrocodone for pain at night.  Do not drive or drink alcohol taking this medication.  You can use Tylenol and ibuprofen for pain during the day.  I do recommend you follow-up with your dentist as soon as possible for further evaluation and management.  If you have any worsening symptoms including high fever, difficulty swallowing, swelling of her throat, shortness of breath you need to go to the emergency room for further evaluation and management.

## 2021-07-04 NOTE — ED Provider Notes (Signed)
[place]    CSN: [id number] Arrival date & time: 07/04/21  1130      History   Chief Complaint Chief Complaint  Patient presents with   Dental Pain    HPI [name] is a 36 y.o. female.   Patient presents today with a 5-day history of worsening tooth pain following recent dental procedure.  Reports that she had a root canal on Tuesday (06/30/2021) and has had worsening pain since that time.  Pain is centralized around the tooth but has spread to involve most of her upper teeth.  Pain is rated 10 on a 0-10 pain scale, localized upper jaw with radiation into sinuses, described as pressure, no aggravating relieving factors identified.  She attempted to call her dentist but was unable to reach them as they are closed for the weekend.  She denies any recent antibiotics.  She is having difficulty eating and drinking due to severity of pain but has no trouble swallowing.  She has had several episodes of nausea and vomiting.  She is managing her secretions and denies any shortness of breath, swelling of throat, muffled voice.  She has tried multiple medications including Tylenol, ibuprofen, leftover prescription pain medication which provided only temporary relief of symptoms.   Past Medical History:  Diagnosis Date   Anemia    Blood transfusion without reported diagnosis    Conjunctival hemorrhage 04/11/2008   Qualifier: Diagnosis of  By: Hassell Done FNP, Nykedtra     Depression    PP no meds   FATIGUE 07/30/2009   Qualifier: Diagnosis of  By: Hassell Done FNP, Tori Milks     GONORRHEA 11/15/2007   Qualifier: Diagnosis of  By: Leilani Merl CMA, Tiffany     Hx of gonorrhea    Unspecified vitamin D deficiency 07/26/2013    Patient Active Problem List   Diagnosis Date Noted   Well woman exam with routine gynecological exam 03/13/2019   Exposure to STD 03/13/2019   S/P cesarean section 05/10/2015    Past Surgical History:  Procedure Laterality Date   CESAREAN SECTION N/A 05/10/2015    Procedure: CESAREAN SECTION;  Surgeon: Jonnie Kind, MD;  Location: [facility];  Service: Obstetrics;  Laterality: N/A;   HERNIA REPAIR     2003   TUBAL LIGATION     VAGINAL DELIVERY     x 3    OB History     Gravida  7   Para  5   Term  5   Preterm      AB  2   Living  5      SAB      IAB      Ectopic      Multiple  0   Live Births  5            Home Medications    Prior to Admission medications   Medication Sig Start Date End Date Taking? Authorizing Provider  clindamycin (CLEOCIN) 300 MG capsule Take 1 capsule (300 mg total) by mouth 3 (three) times daily. 07/04/21  Yes Lendon George, Derry Skill, PA-C  HYDROcodone-acetaminophen (NORCO/VICODIN) 5-325 MG tablet Take 1 tablet by mouth at bedtime for 3 days. 07/04/21 07/07/21 Yes Kc Summerson K, PA-C  ferrous sulfate 325 (65 FE) MG tablet Take 325 mg by mouth daily with breakfast.    [provider]  Multiple Vitamins-Minerals (MULTI-VITAMIN GUMMIES PO) Take by mouth.    [provider]    Family History Family History  Problem Relation  Age of Onset   Hypertension Father    Hypertension Maternal Grandmother    Cancer Neg Hx    Diabetes Neg Hx    Heart disease Neg Hx     Social History Social History   Tobacco Use   Smoking status: Never   Smokeless tobacco: Never  Vaping Use   Vaping Use: Never used  Substance Use Topics   Alcohol use: No   Drug use: No     Allergies   Amoxicillin and Ibuprofen   Review of Systems Review of Systems  Constitutional:  Positive for activity change. Negative for appetite change, fatigue and fever.  HENT:  Positive for dental problem, facial swelling and sinus pressure. Negative for congestion, sneezing and sore throat.   Respiratory:  Negative for cough and shortness of breath.   Cardiovascular:  Negative for chest pain.  Gastrointestinal:  Positive for nausea and vomiting. Negative for abdominal pain and diarrhea.  Musculoskeletal:  Negative for  arthralgias and myalgias.  Neurological:  Negative for dizziness, light-headedness and headaches.    Physical Exam Triage Vital Signs ED Triage Vitals  Enc Vitals Group     BP 07/04/21 1320 (!) 134/98     Pulse Rate 07/04/21 1320 75     Resp 07/04/21 1320 20     Temp 07/04/21 1320 99.1 F (37.3 C)     Temp Source 07/04/21 1320 Oral     SpO2 07/04/21 1320 100 %     Weight --      Height --      Head Circumference --      Peak Flow --      Pain Score 07/04/21 1317 10     Pain Loc --      Pain Edu? --      Excl. in [place]? --    No data found.  Updated Vital Signs BP (!) 134/98 (BP Location: Left Arm)    Pulse 75    Temp 99.1 F (37.3 C) (Oral)    Resp 20    LMP 06/23/2021    SpO2 100%   Visual Acuity Right Eye Distance:   Left Eye Distance:   Bilateral Distance:    Right Eye Near:   Left Eye Near:    Bilateral Near:     Physical Exam Vitals reviewed.  Constitutional:      General: She is awake. She is not in acute distress.    Appearance: Normal appearance. She is well-developed. She is not ill-appearing.     Comments: Very pleasant female appears stated age no acute distress sitting comfortably in exam room  HENT:     Head: Normocephalic and atraumatic.     Mouth/Throat:     Mouth: Mucous membranes are moist.     Dentition: Dental tenderness, gingival swelling and dental abscesses present.     Pharynx: Uvula midline. No oropharyngeal exudate or posterior oropharyngeal erythema.      Comments: No evidence of Ludwig angina Cardiovascular:     Rate and Rhythm: Normal rate and regular rhythm.     Heart sounds: Normal heart sounds, S1 normal and S2 normal. No murmur heard. Pulmonary:     Effort: Pulmonary effort is normal.     Breath sounds: Normal breath sounds. No wheezing, rhonchi or rales.     Comments: Clear to auscultation bilaterally Psychiatric:        Behavior: Behavior is cooperative.     UC Treatments / Results  Labs (all labs ordered are listed,  but only abnormal results are displayed) Labs Reviewed - No data to display  EKG   Radiology No results found.  Procedures Procedures (including critical care time)  Medications Ordered in UC Medications - No data to display  Initial Impression / Assessment and Plan / UC Course  I have reviewed the triage vital signs and the nursing notes.  Pertinent labs & imaging results that were available during my care of the patient were reviewed by me and considered in my medical decision making (see chart for details).     Will cover for dental infection given clinical presentation and recent dental procedure.  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  Discussed that this can cause GI side effects and recommend she take it with food.  If she has severe diarrhea she is to stop the medication and be seen.  She can alternate Tylenol and ibuprofen for pain during the day.  She was given several doses of hydrocodone at night with instruction not to drive or drink alcohol while taking this medication as drowsiness is a common side effect.  Recommended she follow-up with dentist as soon as possible.  Discussed that if she has any worsening symptoms including high fever, difficulty swallowing, swelling of her throat, muffled voice, increased pain she would need to go to the emergency room.  Strict return precautions given to which she expressed understanding.  Final Clinical Impressions(s) / UC Diagnoses   Final diagnoses:  Dental infection  Pain, dental     Discharge Instructions      Start clindamycin 3 times a day to cover for infection.  Use hydrocodone for pain at night.  Do not drive or drink alcohol taking this medication.  You can use Tylenol and ibuprofen for pain during the day.  I do recommend you follow-up with your dentist as soon as possible for further evaluation and management.  If you have any worsening symptoms including high fever, difficulty  swallowing, swelling of her throat, shortness of breath you need to go to the emergency room for further evaluation and management.     ED Prescriptions     Medication Sig Dispense Auth. Provider   clindamycin (CLEOCIN) 300 MG capsule Take 1 capsule (300 mg total) by mouth 3 (three) times daily. 30 capsule Jinx Gilden K, PA-C   HYDROcodone-acetaminophen (NORCO/VICODIN) 5-325 MG tablet Take 1 tablet by mouth at bedtime for 3 days. 3 tablet Shakura Cowing K, PA-C      I have reviewed the PDMP during this encounter.   Terrilee Croak, PA-C 07/04/21 1408

## 2021-08-12 DIAGNOSIS — Z113 Encounter for screening for infections with a predominantly sexual mode of transmission: Secondary | ICD-10-CM | POA: Diagnosis not present

## 2021-08-12 DIAGNOSIS — Z1329 Encounter for screening for other suspected endocrine disorder: Secondary | ICD-10-CM | POA: Diagnosis not present

## 2021-08-12 DIAGNOSIS — F319 Bipolar disorder, unspecified: Secondary | ICD-10-CM | POA: Diagnosis not present

## 2021-08-12 DIAGNOSIS — K5900 Constipation, unspecified: Secondary | ICD-10-CM | POA: Diagnosis not present

## 2021-08-12 DIAGNOSIS — R630 Anorexia: Secondary | ICD-10-CM | POA: Diagnosis not present

## 2021-09-14 ENCOUNTER — Ambulatory Visit (INDEPENDENT_AMBULATORY_CARE_PROVIDER_SITE_OTHER): Payer: Medicaid Other | Admitting: Obstetrics and Gynecology

## 2021-09-14 ENCOUNTER — Encounter: Payer: Self-pay | Admitting: Obstetrics and Gynecology

## 2021-09-14 ENCOUNTER — Other Ambulatory Visit (HOSPITAL_COMMUNITY)
Admission: RE | Admit: 2021-09-14 | Discharge: 2021-09-14 | Disposition: A | Discharge status: Home / Self Care | Payer: Medicaid Other | Source: Ambulatory Visit | Attending: Obstetrics and Gynecology | Admitting: Obstetrics and Gynecology

## 2021-09-14 VITALS — BP 136/92 | HR 72 | Ht 62.0 in | Wt 115.1 lb

## 2021-09-14 DIAGNOSIS — F419 Anxiety disorder, unspecified: Secondary | ICD-10-CM

## 2021-09-14 DIAGNOSIS — Z01419 Encounter for gynecological examination (general) (routine) without abnormal findings: Secondary | ICD-10-CM

## 2021-09-14 LAB — POCT URINALYSIS DIPSTICK
Bilirubin, UA: NEGATIVE
Glucose, UA: NEGATIVE
Ketones, UA: NEGATIVE
Leukocytes, UA: NEGATIVE
Nitrite, UA: NEGATIVE
Protein, UA: POSITIVE — AB
Spec Grav, UA: 1.025 (ref 1.010–1.025)
Urobilinogen, UA: NEGATIVE E.U./dL — AB
pH, UA: 6 (ref 5.0–8.0)

## 2021-09-14 NOTE — Progress Notes (Signed)
GYN pt in office for annual exam. Pt is interesting in behavioral health counseling for anxiety and depression. Pt thinks she may have a UTI due to lower back pain.  ?

## 2021-09-14 NOTE — Progress Notes (Signed)
Subjective:  ?  ? [name] is a 36 y.o. female P5 with LMP 09/11/21 and BMI 21 who is here for a comprehensive physical exam. The patient reports no problems. She reports a monthly period lasting 3-5 days. She denies pelvic pain or abnormal discharge. She is sexually active using BTL for contraception. She denies urinary complaints. She reports constipation. Patient also admits to feeling very stressed ? ?Past Medical History:  ?Diagnosis Date  ? Anemia   ? Blood transfusion without reported diagnosis   ? Conjunctival hemorrhage 04/11/2008  ? Qualifier: Diagnosis of  By: Daphine Deutscher FNP, Zena Amos    ? Depression   ? PP no meds  ? FATIGUE 07/30/2009  ? Qualifier: Diagnosis of  By: Daphine Deutscher FNP, Zena Amos    ? GONORRHEA 11/15/2007  ? Qualifier: Diagnosis of  By: Massie Maroon CMA, Tiffany    ? Hx of gonorrhea   ? Unspecified vitamin D deficiency 07/26/2013  ? ?Past Surgical History:  ?Procedure Laterality Date  ? CESAREAN SECTION N/A 05/10/2015  ? Procedure: CESAREAN SECTION;  Surgeon: Tilda Burrow, MD;  Location: [facility];  Service: Obstetrics;  Laterality: N/A;  ? HERNIA REPAIR    ? 2003  ? TUBAL LIGATION    ? VAGINAL DELIVERY    ? x 3  ? ?Family History  ?Problem Relation Age of Onset  ? Hypertension Father   ? Hypertension Maternal Grandmother   ? Cancer Neg Hx   ? Diabetes Neg Hx   ? Heart disease Neg Hx   ? ? ?Social History  ? ?Socioeconomic History  ? Marital status: Single  ?  Spouse name: Not on file  ? Number of children: 5  ? Years of education: Not on file  ? Highest education level: Not on file  ?Occupational History  ? Occupation: Med Forensic scientist  ?  Employer: [employer]  ? Occupation: [employer]  ?Tobacco Use  ? Smoking status: Never  ? Smokeless tobacco: Never  ?Vaping Use  ? Vaping Use: Never used  ?Substance and Sexual Activity  ? Alcohol use: No  ? Drug use: No  ? Sexual activity: Yes  ?  Partners: Male  ?  Birth control/protection: Surgical  ?Other Topics Concern  ? Not on file  ?Social History  Narrative  ? Not on file  ? ?Social Determinants of Health  ? ?Financial Resource Strain: Not on file  ?Food Insecurity: Not on file  ?Transportation Needs: Not on file  ?Physical Activity: Not on file  ?Stress: Not on file  ?Social Connections: Not on file  ?Intimate Partner Violence: Not on file  ? ?Health Maintenance  ?Topic Date Due  ? TETANUS/TDAP  01/28/2019  ? INFLUENZA VACCINE  01/05/2022  ? PAP SMEAR-Modifier  03/12/2022  ? Hepatitis C Screening  Completed  ? HIV Screening  Completed  ? HPV VACCINES  Aged Out  ? ? ?  ? ?Review of Systems ?Pertinent items noted in HPI and remainder of comprehensive ROS otherwise negative.  ? ?Objective:  ?Blood pressure (!) 136/92, pulse 72, height 5\' 2"  (1.575 m), weight 115 lb 1.6 oz (52.2 kg), last menstrual period 09/11/2021, unknown if currently breastfeeding. ? ? GENERAL: Well-developed, well-nourished female in no acute distress.  ?HEENT: Normocephalic, atraumatic. Sclerae anicteric.  ?NECK: Supple. Normal thyroid.  ?LUNGS: Clear to auscultation bilaterally.  ?HEART: Regular rate and rhythm. ?BREASTS: Symmetric in size. No palpable masses or lymphadenopathy, skin changes, or nipple drainage. ?ABDOMEN: Soft, nontender, nondistended. No organomegaly. ?PELVIC: Normal external female  genitalia. Vagina is pink and rugated.  Normal discharge. Normal appearing cervix. Uterus is normal in size. No adnexal mass or tenderness. Chaperone present during the pelvic exam ?EXTREMITIES: No cyanosis, clubbing, or edema, 2+ distal pulses. ?  ?  ?Assessment:  ? ? Healthy female exam.    ?  ?Plan:  ? ? Pap smear collected ?Vaginal swab collected for STI screening. Patient reports blood work with PCP ?Urine culture per patient request ?Patient will be contacted with abnormal results ?See After Visit Summary for Counseling Recommendations  ? ?

## 2021-09-15 LAB — CERVICOVAGINAL ANCILLARY ONLY
Chlamydia: NEGATIVE
Comment: NEGATIVE
Comment: NORMAL
Neisseria Gonorrhea: NEGATIVE

## 2021-09-16 LAB — CYTOLOGY - PAP: Diagnosis: NEGATIVE

## 2021-09-17 LAB — URINE CULTURE

## 2021-09-24 DIAGNOSIS — F319 Bipolar disorder, unspecified: Secondary | ICD-10-CM | POA: Diagnosis not present

## 2021-09-24 DIAGNOSIS — Z599 Problem related to housing and economic circumstances, unspecified: Secondary | ICD-10-CM | POA: Diagnosis not present

## 2021-09-24 DIAGNOSIS — Z719 Counseling, unspecified: Secondary | ICD-10-CM | POA: Diagnosis not present

## 2021-09-24 DIAGNOSIS — Z0000 Encounter for general adult medical examination without abnormal findings: Secondary | ICD-10-CM | POA: Diagnosis not present

## 2021-10-01 ENCOUNTER — Encounter: Payer: Medicaid Other | Admitting: Licensed Clinical Social Worker

## 2022-04-08 ENCOUNTER — Other Ambulatory Visit: Payer: Self-pay

## 2022-04-08 ENCOUNTER — Emergency Department (HOSPITAL_COMMUNITY)
Admission: EM | Admit: 2022-04-08 | Discharge: 2022-04-09 | Disposition: A | Discharge status: Home / Self Care | Payer: Medicaid Other | Attending: Emergency Medicine | Admitting: Emergency Medicine

## 2022-04-08 ENCOUNTER — Encounter (HOSPITAL_COMMUNITY): Payer: Self-pay

## 2022-04-08 ENCOUNTER — Emergency Department (HOSPITAL_COMMUNITY): Payer: Medicaid Other

## 2022-04-08 DIAGNOSIS — M545 Low back pain, unspecified: Secondary | ICD-10-CM | POA: Insufficient documentation

## 2022-04-08 DIAGNOSIS — K8020 Calculus of gallbladder without cholecystitis without obstruction: Secondary | ICD-10-CM | POA: Diagnosis not present

## 2022-04-08 DIAGNOSIS — N201 Calculus of ureter: Secondary | ICD-10-CM | POA: Diagnosis not present

## 2022-04-08 DIAGNOSIS — N200 Calculus of kidney: Secondary | ICD-10-CM | POA: Diagnosis not present

## 2022-04-08 DIAGNOSIS — N390 Urinary tract infection, site not specified: Secondary | ICD-10-CM | POA: Diagnosis not present

## 2022-04-08 DIAGNOSIS — R1032 Left lower quadrant pain: Secondary | ICD-10-CM | POA: Diagnosis present

## 2022-04-08 DIAGNOSIS — Q446 Cystic disease of liver: Secondary | ICD-10-CM | POA: Diagnosis not present

## 2022-04-08 LAB — URINALYSIS, ROUTINE W REFLEX MICROSCOPIC
Bilirubin Urine: NEGATIVE
Glucose, UA: NEGATIVE mg/dL
Ketones, ur: 20 mg/dL — AB
Nitrite: NEGATIVE
Protein, ur: 100 mg/dL — AB
RBC / HPF: >50 RBC/hpf — ABNORMAL HIGH (ref 0–5)
Specific Gravity, Urine: 1.019 (ref 1.005–1.030)
pH: 7 (ref 5.0–8.0)

## 2022-04-08 LAB — CBC WITH DIFFERENTIAL/PLATELET
Abs Immature Granulocytes: 0.01 10*3/uL (ref 0.00–0.07)
Basophils Absolute: 0 10*3/uL (ref 0.0–0.1)
Basophils Relative: 0 %
Eosinophils Absolute: 0 10*3/uL (ref 0.0–0.5)
Eosinophils Relative: 1 %
HCT: 36 % (ref 36.0–46.0)
Hemoglobin: 12.3 g/dL (ref 12.0–15.0)
Immature Granulocytes: 0 %
Lymphocytes Relative: 22 %
Lymphs Abs: 1.2 10*3/uL (ref 0.7–4.0)
MCH: 33.3 pg (ref 26.0–34.0)
MCHC: 34.2 g/dL (ref 30.0–36.0)
MCV: 97.6 fL (ref 80.0–100.0)
Monocytes Absolute: 0.3 10*3/uL (ref 0.1–1.0)
Monocytes Relative: 5 %
Neutro Abs: 3.8 10*3/uL (ref 1.7–7.7)
Neutrophils Relative %: 72 %
Platelets: 238 10*3/uL (ref 150–400)
RBC: 3.69 MIL/uL — ABNORMAL LOW (ref 3.87–5.11)
RDW: 12.1 % (ref 11.5–15.5)
WBC: 5.2 10*3/uL (ref 4.0–10.5)
nRBC: 0 % (ref 0.0–0.2)

## 2022-04-08 LAB — I-STAT BETA HCG BLOOD, ED (MC, WL, AP ONLY): I-stat hCG, quantitative: <5 m[IU]/mL (ref <5)

## 2022-04-08 LAB — COMPREHENSIVE METABOLIC PANEL
ALT: 21 U/L (ref 0–44)
AST: 31 U/L (ref 15–41)
Albumin: 4.2 g/dL (ref 3.5–5.0)
Alkaline Phosphatase: 47 U/L (ref 38–126)
Anion gap: 11 (ref 5–15)
BUN: 11 mg/dL (ref 6–20)
CO2: 21 mmol/L — ABNORMAL LOW (ref 22–32)
Calcium: 9.6 mg/dL (ref 8.9–10.3)
Chloride: 106 mmol/L (ref 98–111)
Creatinine, Ser: 0.92 mg/dL (ref 0.44–1.00)
GFR, Estimated: >60 mL/min (ref >60)
Glucose, Bld: 132 mg/dL — ABNORMAL HIGH (ref 70–99)
Potassium: 3.6 mmol/L (ref 3.5–5.1)
Sodium: 138 mmol/L (ref 135–145)
Total Bilirubin: 0.8 mg/dL (ref 0.3–1.2)
Total Protein: 7.3 g/dL (ref 6.5–8.1)

## 2022-04-08 LAB — LIPASE, BLOOD: Lipase: 25 U/L (ref 11–51)

## 2022-04-08 MED ORDER — OXYCODONE-ACETAMINOPHEN 5-325 MG PO TABS
1.0000 | ORAL_TABLET | Freq: Once | ORAL | Status: AC
  Administered 2022-04-08: 1 via ORAL
  Filled 2022-04-08: qty 1

## 2022-04-08 MED ORDER — OXYCODONE-ACETAMINOPHEN 5-325 MG PO TABS: 1.0000 | ORAL_TABLET | ORAL | Status: DC | PRN

## 2022-04-08 MED ORDER — ONDANSETRON 4 MG PO TBDP
4.0000 mg | ORAL_TABLET | Freq: Once | ORAL | Status: AC
  Administered 2022-04-08: 4 mg via ORAL
  Filled 2022-04-08: qty 1

## 2022-04-08 MED ORDER — ONDANSETRON 4 MG PO TBDP
4.0000 mg | ORAL_TABLET | ORAL | Status: DC | PRN
  Administered 2022-04-08: 4 mg via ORAL
  Filled 2022-04-08: qty 1

## 2022-04-08 NOTE — ED Provider Triage Note (Signed)
Emergency Medicine Provider Triage Evaluation Note  [name] , a 36 y.o. female  was evaluated in triage.  Pt complains of abrupt onset of left sided flank abdominal pain starting around 12-1 PM today.  She had some vomiting on arrival to the ED.  She has a history of hernia repair and tubal ligation.  No vaginal bleeding or discharge.  No dysuria.  Review of Systems  Positive: Flank pain, vomiting Negative: Fever  Physical Exam  BP (!) 134/103 (BP Location: Left Arm)   Pulse 76   Temp 97.9 F (36.6 C) (Oral)   Resp 16   SpO2 100%  Gen:   Awake, uncomfortable appearing Resp:  Normal effort  MSK:   Moves extremities without difficulty  Other:  Generalized abdominal and flank pain, patient pushes my hand away when I try to palpate  Medical Decision Making  Medically screening exam initiated at 5:43 PM.  Appropriate orders placed.  Zebedee Iba was informed that the remainder of the evaluation will be completed by another provider, this initial triage assessment does not replace that evaluation, and the importance of remaining in the ED until their evaluation is complete.     Carlisle Cater, PA-C 04/08/22 1744

## 2022-04-08 NOTE — ED Triage Notes (Signed)
Complains of sudden onset of left flank and abd pain that started around lunch time.  Had episode of vomiting upon arrival.  Denies any other symptoms.

## 2022-04-08 NOTE — ED Provider Notes (Signed)
[facility] EMERGENCY DEPARTMENT Provider Note   CSN: [id number] Arrival date & time: 04/08/22  1704     History  Chief Complaint  Patient presents with   Abdominal Pain   Flank Pain    [name] is a 36 y.o. female.  36 year old female with no significant past medical history presents with complaint of sudden onset left lower back pain, radiating to left lower quadrant area.  Pain is worse with walking, improved with Percocet provided in triage, has been constant since onset around 12:00 today when she was sitting in her car on her lunch break.  Associated with nausea and vomiting.  Denies changes in bowel or bladder habits (reports constipation at baseline with bowel movement every other week).  Reports end of last menstrual cycle yesterday.  Prior abdominal surgery includes C-section and tubal ligation.       Home Medications Prior to Admission medications   Medication Sig Start Date End Date Taking? Authorizing Provider  cephALEXin (KEFLEX) 500 MG capsule Take 1 capsule (500 mg total) by mouth 2 (two) times daily for 5 days. 04/09/22 04/14/22 Yes Jeannie Fend, PA-C  HYDROcodone-acetaminophen (NORCO/VICODIN) 5-325 MG tablet Take 1 tablet by mouth every 4 (four) hours as needed. 04/09/22  Yes Jeannie Fend, PA-C  ondansetron (ZOFRAN-ODT) 4 MG disintegrating tablet Take 1 tablet (4 mg total) by mouth every 8 (eight) hours as needed for nausea or vomiting. 04/09/22  Yes Jeannie Fend, PA-C  clindamycin (CLEOCIN) 300 MG capsule Take 1 capsule (300 mg total) by mouth 3 (three) times daily. 07/04/21   Raspet, Noberto Retort, PA-C  ferrous sulfate 325 (65 FE) MG tablet Take 325 mg by mouth daily with breakfast.    [provider]  Multiple Vitamins-Minerals (MULTI-VITAMIN GUMMIES PO) Take by mouth.    [provider]      Allergies    Amoxicillin and Ibuprofen    Review of Systems   Review of Systems Negative except as per HPI Physical  Exam Updated Vital Signs BP 124/78 (BP Location: Left Arm)   Pulse (!) 108   Temp 98.6 F (37 C) (Oral)   Resp 18   Ht 5\' 2"  (1.575 m)   Wt 52.2 kg   SpO2 100%   BMI 21.03 kg/m  Physical Exam Vitals and nursing note reviewed.  Constitutional:      General: She is not in acute distress.    Appearance: She is well-developed. She is not diaphoretic.  HENT:     Head: Normocephalic and atraumatic.  Cardiovascular:     Rate and Rhythm: Normal rate and regular rhythm.     Heart sounds: Normal heart sounds.  Pulmonary:     Effort: Pulmonary effort is normal.     Breath sounds: Normal breath sounds.  Abdominal:     Palpations: Abdomen is soft.     Tenderness: There is generalized abdominal tenderness.  Neurological:     Mental Status: She is alert and oriented to person, place, and time.  Psychiatric:        Behavior: Behavior normal.     ED Results / Procedures / Treatments   Labs (all labs ordered are listed, but only abnormal results are displayed) Labs Reviewed  CBC WITH DIFFERENTIAL/PLATELET - Abnormal; Notable for the following components:      Result Value   RBC 3.69 (*)    All other components within normal limits  COMPREHENSIVE METABOLIC PANEL - Abnormal; Notable for the following components:  CO2 21 (*)    Glucose, Bld 132 (*)    All other components within normal limits  URINALYSIS, ROUTINE W REFLEX MICROSCOPIC - Abnormal; Notable for the following components:   APPearance CLOUDY (*)    Hgb urine dipstick LARGE (*)    Ketones, ur 20 (*)    Protein, ur 100 (*)    Leukocytes,Ua TRACE (*)    RBC / HPF >50 (*)    Bacteria, UA RARE (*)    All other components within normal limits  URINE CULTURE  LIPASE, BLOOD  I-STAT BETA HCG BLOOD, ED ([facility], [facility], [facility] ONLY)    EKG None  Radiology CT Renal Stone Study  Result Date: 04/08/2022 CLINICAL DATA:  Flank pain.  Concern for kidney stone. EXAM: CT ABDOMEN AND PELVIS WITHOUT CONTRAST TECHNIQUE: Multidetector CT  imaging of the abdomen and pelvis was performed following the standard protocol without IV contrast. RADIATION DOSE REDUCTION: This exam was performed according to the departmental dose-optimization program which includes automated exposure control, adjustment of the mA and/or kV according to patient size and/or use of iterative reconstruction technique. COMPARISON:  None Available. FINDINGS: Evaluation of this exam is limited in the absence of intravenous contrast. Lower chest: There is lung bases are clear. No intra-abdominal free air or free fluid. Hepatobiliary: A 1 cm cystic structure within the liver anterior to the gallbladder. The liver is otherwise unremarkable. No biliary dilatation. Small gallstone. No pericholecystic fluid or evidence of acute cholecystitis by CT. Pancreas: Unremarkable. No pancreatic ductal dilatation or surrounding inflammatory changes. Spleen: Normal in size without focal abnormality. Adrenals/Urinary Tract: The adrenal glands are unremarkable. There is a 6 mm stone in the proximal left ureter. There is mild fullness of the left renal collecting consistent with mild diffuse edema in the renal parenchyma. Correlation with urinalysis recommended to exclude superimposed UTI. Several additional scattered nonobstructing bilateral renal calculi measure up to 2 mm. There is no hydronephrosis on the right. The urinary bladder is unremarkable. Stomach/Bowel: There is moderate stool throughout the colon. There is no bowel obstruction or active inflammation. The appendix is normal. Vascular/Lymphatic: The abdominal aorta and IVC are grossly unremarkable on this noncontrast CT. No portal venous gas. There is no adenopathy. Reproductive: The uterus is anteverted and suboptimally evaluated. No adnexal masses. Other: Mild diffuse mesenteric edema. Ventral hernia repair with mesh. Musculoskeletal: No acute or significant osseous findings. IMPRESSION: 1. A 6 mm proximal left ureteral stone with mild  fullness of the left renal collecting system. Correlation with urinalysis recommended to exclude superimposed UTI. 2. Additional scattered punctate nonobstructing bilateral renal calculi. No hydronephrosis on the right. 3. Cholelithiasis. 4. No bowel obstruction. Normal appendix. Electronically Signed   By: Elgie Collard M.D.   On: 04/08/2022 23:40    Procedures Procedures    Medications Ordered in ED Medications  ondansetron (ZOFRAN-ODT) disintegrating tablet 4 mg (4 mg Oral Given 04/08/22 2237)  ondansetron (ZOFRAN-ODT) disintegrating tablet 4 mg (4 mg Oral Given 04/08/22 1756)  oxyCODONE-acetaminophen (PERCOCET/ROXICET) 5-325 MG per tablet 1 tablet (1 tablet Oral Given 04/08/22 1912)  oxyCODONE-acetaminophen (PERCOCET/ROXICET) 5-325 MG per tablet 1 tablet (1 tablet Oral Given 04/08/22 2237)    ED Course/ Medical Decision Making/ A&P                           Medical Decision Making Amount and/or Complexity of Data Reviewed Labs: ordered. Radiology: ordered.  Risk Prescription drug management.   This patient presents to the  ED for concern of left flank pain, this involves an extensive number of treatment options, and is a complaint that carries with it a high risk of complications and morbidity.  The differential diagnosis includes kidney stone, urinary tract infection, pyelonephritis, diverticulitis   Co morbidities that complicate the patient evaluation  Anemia, prior abdominal surgeries including tubal ligation, cesarean section   Additional history obtained:  External records from outside source obtained and reviewed including prior labs on file for comparison   Lab Tests:  I Ordered, and personally interpreted labs.  The pertinent results include: CBC with normal white count, CMP with normal renal function.  Urinalysis with large hemoglobin, ketones, protein, trace leukocytes with greater than 50 red cells, 0-5 white cells, rare bacteria and 11-20 epithelials.  hCG  negative.  Lipase normal.   Imaging Studies ordered:  I ordered imaging studies including CT stone study  I independently visualized and interpreted imaging which showed 73mm left proximal ureteral stone I agree with the radiologist interpretation   Consultations Obtained:  I requested consultation with the urologist, Dr. Junious Silk,  and discussed lab and imaging findings as well as pertinent plan - they recommend: send urine cx, dc on Keflex, follow up in clinic with return to ER precautions.    Problem List / ED Course / Critical interventions / Medication management  36 year old female with complaint of left flank pain with sudden onset earlier today.  Improved with Percocet provided in triage which is now wearing off.  CT shows a proximal left ureteral stone.  Urinalysis is contaminated although suggest infection with trace leukocytes, ketones and protein.  White count is normal, is afebrile, has normal renal function.  hCG is negative.  Case was discussed with ER attending, Dr. Tyrone Nine who recommends consultation with urology.  Discussed with Dr. Junious Silk with urology who recommends antibiotics, send urine culture and patient can follow-up in the office.  Patient is provided with return to ER precautions as well as Keflex, Zofran, Norco. I ordered medication including Percocet, Zofran for pain, nausea Reevaluation of the patient after these medicines showed that the patient improved I have reviewed the patients home medicines and have made adjustments as needed   Social Determinants of Health:  PCP on file   Test / Admission - Considered:  Plan is to follow-up with urology with return to ER precautions for management of her proximal ureteral stone with concern for urinary tract infection.         Final Clinical Impression(s) / ED Diagnoses Final diagnoses:  Ureteral stone  Urinary tract infection in female    Rx / DC Orders ED Discharge Orders          Ordered     HYDROcodone-acetaminophen (NORCO/VICODIN) 5-325 MG tablet  Every 4 hours PRN        04/09/22 0008    ondansetron (ZOFRAN-ODT) 4 MG disintegrating tablet  Every 8 hours PRN        04/09/22 0008    cephALEXin (KEFLEX) 500 MG capsule  2 times daily        04/09/22 0008              Tacy Learn, PA-C 04/09/22 0454    Ezequiel Essex, MD 04/09/22 [phone]

## 2022-04-09 ENCOUNTER — Other Ambulatory Visit: Payer: Self-pay | Admitting: Urology

## 2022-04-09 ENCOUNTER — Encounter (HOSPITAL_BASED_OUTPATIENT_CLINIC_OR_DEPARTMENT_OTHER): Payer: Self-pay | Admitting: Urology

## 2022-04-09 DIAGNOSIS — N201 Calculus of ureter: Secondary | ICD-10-CM | POA: Diagnosis not present

## 2022-04-09 LAB — URINE CULTURE: Culture: >=100000 — AB

## 2022-04-09 MED ORDER — CEPHALEXIN 500 MG PO CAPS: 500.0000 mg | ORAL_CAPSULE | Freq: Two times a day (BID) | ORAL | 0 refills | Status: DC

## 2022-04-09 MED ORDER — ONDANSETRON 4 MG PO TBDP: 4.0000 mg | ORAL_TABLET | Freq: Three times a day (TID) | ORAL | 0 refills | Status: DC | PRN

## 2022-04-09 MED ORDER — HYDROCODONE-ACETAMINOPHEN 5-325 MG PO TABS: 1.0000 | ORAL_TABLET | ORAL | 0 refills | Status: DC | PRN

## 2022-04-09 NOTE — Discharge Instructions (Addendum)
Norco as needed as prescribed, do not drive or operate machinery while taking Norco. Zofran as needed as prescribed for nausea and vomiting. Keflex as prescribed and complete the full course.  Follow up with urology, call to schedule an appointment in the morning.  ER for fever, pain or vomiting not controlled with medications.

## 2022-04-09 NOTE — Progress Notes (Signed)
Pre-op phone call complete. Procedure date and arrival time confirmed. Patient allergies, medical history, and medications verified. Patient denies any recent history of chest pain or covid. Patient advised that she can have clear liquids until 0200 or choose to be NPO food and clear liquids at midnight. Driver secured.

## 2022-04-10 ENCOUNTER — Emergency Department (HOSPITAL_COMMUNITY)
Admission: EM | Admit: 2022-04-10 | Discharge: 2022-04-10 | Disposition: A | Discharge status: Home / Self Care | Payer: Medicaid Other | Attending: Emergency Medicine | Admitting: Emergency Medicine

## 2022-04-10 ENCOUNTER — Encounter (HOSPITAL_COMMUNITY): Payer: Self-pay | Admitting: *Deleted

## 2022-04-10 ENCOUNTER — Other Ambulatory Visit: Payer: Self-pay

## 2022-04-10 ENCOUNTER — Telehealth (HOSPITAL_BASED_OUTPATIENT_CLINIC_OR_DEPARTMENT_OTHER): Payer: Self-pay | Admitting: *Deleted

## 2022-04-10 DIAGNOSIS — N23 Unspecified renal colic: Secondary | ICD-10-CM | POA: Insufficient documentation

## 2022-04-10 DIAGNOSIS — R109 Unspecified abdominal pain: Secondary | ICD-10-CM | POA: Diagnosis present

## 2022-04-10 HISTORY — DX: Calculus of kidney: N20.0

## 2022-04-10 LAB — BASIC METABOLIC PANEL
Anion gap: 7 (ref 5–15)
BUN: 19 mg/dL (ref 6–20)
CO2: 25 mmol/L (ref 22–32)
Calcium: 9.1 mg/dL (ref 8.9–10.3)
Chloride: 107 mmol/L (ref 98–111)
Creatinine, Ser: 1.61 mg/dL — ABNORMAL HIGH (ref 0.44–1.00)
GFR, Estimated: 42 mL/min — ABNORMAL LOW (ref >60)
Glucose, Bld: 91 mg/dL (ref 70–99)
Potassium: 3.9 mmol/L (ref 3.5–5.1)
Sodium: 139 mmol/L (ref 135–145)

## 2022-04-10 LAB — URINALYSIS, ROUTINE W REFLEX MICROSCOPIC
Bilirubin Urine: NEGATIVE
Glucose, UA: NEGATIVE mg/dL
Ketones, ur: 5 mg/dL — AB
Leukocytes,Ua: NEGATIVE
Nitrite: NEGATIVE
Protein, ur: 30 mg/dL — AB
RBC / HPF: >50 RBC/hpf — ABNORMAL HIGH (ref 0–5)
Specific Gravity, Urine: 1.026 (ref 1.005–1.030)
pH: 5 (ref 5.0–8.0)

## 2022-04-10 LAB — CBC WITH DIFFERENTIAL/PLATELET
Abs Immature Granulocytes: 0.02 10*3/uL (ref 0.00–0.07)
Basophils Absolute: 0 10*3/uL (ref 0.0–0.1)
Basophils Relative: 0 %
Eosinophils Absolute: 0.1 10*3/uL (ref 0.0–0.5)
Eosinophils Relative: 1 %
HCT: 36.9 % (ref 36.0–46.0)
Hemoglobin: 12.3 g/dL (ref 12.0–15.0)
Immature Granulocytes: 0 %
Lymphocytes Relative: 17 %
Lymphs Abs: 1.2 10*3/uL (ref 0.7–4.0)
MCH: 33.3 pg (ref 26.0–34.0)
MCHC: 33.3 g/dL (ref 30.0–36.0)
MCV: 100 fL (ref 80.0–100.0)
Monocytes Absolute: 0.6 10*3/uL (ref 0.1–1.0)
Monocytes Relative: 9 %
Neutro Abs: 5.1 10*3/uL (ref 1.7–7.7)
Neutrophils Relative %: 73 %
Platelets: 240 10*3/uL (ref 150–400)
RBC: 3.69 MIL/uL — ABNORMAL LOW (ref 3.87–5.11)
RDW: 12.3 % (ref 11.5–15.5)
WBC: 7 10*3/uL (ref 4.0–10.5)
nRBC: 0 % (ref 0.0–0.2)

## 2022-04-10 MED ORDER — SODIUM CHLORIDE 0.9 % IV BOLUS
1000.0000 mL | Freq: Once | INTRAVENOUS | Status: AC
  Administered 2022-04-10: 1000 mL via INTRAVENOUS

## 2022-04-10 MED ORDER — OXYCODONE-ACETAMINOPHEN 5-325 MG PO TABS: 1.0000 | ORAL_TABLET | Freq: Three times a day (TID) | ORAL | 0 refills | Status: DC | PRN

## 2022-04-10 MED ORDER — HYDROMORPHONE HCL 1 MG/ML IJ SOLN
1.0000 mg | Freq: Once | INTRAMUSCULAR | Status: AC
  Administered 2022-04-10: 1 mg via INTRAVENOUS
  Filled 2022-04-10: qty 1

## 2022-04-10 MED ORDER — ONDANSETRON HCL 4 MG PO TABS: 4.0000 mg | ORAL_TABLET | Freq: Four times a day (QID) | ORAL | 0 refills | Status: DC

## 2022-04-10 MED ORDER — MORPHINE SULFATE (PF) 4 MG/ML IV SOLN
4.0000 mg | Freq: Once | INTRAVENOUS | Status: AC
  Administered 2022-04-10: 4 mg via INTRAVENOUS
  Filled 2022-04-10: qty 1

## 2022-04-10 MED ORDER — ONDANSETRON HCL 4 MG/2ML IJ SOLN
4.0000 mg | Freq: Once | INTRAMUSCULAR | Status: AC
  Administered 2022-04-10: 4 mg via INTRAVENOUS
  Filled 2022-04-10: qty 2

## 2022-04-10 NOTE — Progress Notes (Signed)
ED Antimicrobial Stewardship Positive Culture Follow Up   [name] is an 36 y.o. female who presented to [facility] on 04/08/2022 with a chief complaint of  Chief Complaint  Patient presents with   Abdominal Pain   Flank Pain    Recent Results (from the past 720 hour(s))  Urine Culture     Status: Abnormal   Collection Time: 04/08/22  8:06 PM   Specimen: Urine, Clean Catch  Result Value Ref Range Status   Specimen Description URINE, CLEAN CATCH  Final   Special Requests NONE  Final   Culture (A)  Final    >=100,000 COLONIES/mL LACTOBACILLUS SPECIES Standardized susceptibility testing for this organism is not available. Performed at [facility], [place] [address], [place], [place] [address]    Report Status 04/09/2022 FINAL  Final    [x]  Prescribed Keflex. Lactobacillus on urine culture is rarely actionable and no changes to antimicrobial therapy are indicated based on this culture. Recommend patient to f/u with provider that is managing stone/lithotripsy so that they are aware and can re-culture if they feel is warranted.  ED Provider: Garnette Gunner MD  Lorelei Pont, PharmD, BCPS 04/10/2022 10:45 AM ED Clinical Pharmacist -  [phone]

## 2022-04-10 NOTE — ED Provider Notes (Signed)
[name]   CSN: [id number] Arrival date & time: 04/10/22  1422     History  Chief Complaint  Patient presents with   Flank Pain    [name] is a 36 y.o. female.  36 year old female with prior medical history as detailed below presents for evaluation.  Patient was diagnosed with ureteral colic 2 days prior.  Patient with scheduled lithotripsy on Monday.  Patient reports that she was seen in the urology clinic yesterday.  Today she presents with complaint of significant persistent pain.  She also reports associated nausea and vomiting.  The history is provided by the patient and medical records.       Home Medications Prior to Admission medications   Medication Sig Start Date End Date Taking? Authorizing Provider  Multiple Vitamins-Minerals (MULTI-VITAMIN GUMMIES PO) Take 1 tablet by mouth daily.   Yes [provider]  ondansetron (ZOFRAN) 4 MG tablet Take 1 tablet (4 mg total) by mouth every 6 (six) hours. 04/10/22  Yes Wynetta Fines, MD  ondansetron (ZOFRAN-ODT) 4 MG disintegrating tablet Take 1 tablet (4 mg total) by mouth every 8 (eight) hours as needed for nausea or vomiting. 04/09/22  Yes Jeannie Fend, PA-C  oxyCODONE-acetaminophen (PERCOCET/ROXICET) 5-325 MG tablet Take 1 tablet by mouth every 8 (eight) hours as needed for severe pain. 04/10/22  Yes Wynetta Fines, MD  cephALEXin (KEFLEX) 500 MG capsule Take 1 capsule (500 mg total) by mouth 2 (two) times daily for 5 days. Patient not taking: Reported on 04/10/2022 04/09/22 04/14/22  Army Melia A, PA-C  clindamycin (CLEOCIN) 300 MG capsule Take 1 capsule (300 mg total) by mouth 3 (three) times daily. 07/04/21   Raspet, Noberto Retort, PA-C  HYDROcodone-acetaminophen (NORCO/VICODIN) 5-325 MG tablet Take 1 tablet by mouth every 4 (four) hours as needed. Patient not taking: Reported on 04/10/2022 04/09/22   Jeannie Fend, PA-C      Allergies     Amoxicillin    Review of Systems   Review of Systems  All other systems reviewed and are negative.   Physical Exam Updated Vital Signs BP 103/71   Pulse 78   Temp 98.1 F (36.7 C) (Oral)   Resp 18   Wt 52.2 kg   LMP 04/03/2022   SpO2 98%   BMI 21.03 kg/m  Physical Exam Vitals and nursing [name] reviewed.  Constitutional:      General: She is not in acute distress.    Appearance: Normal appearance. She is well-developed.  HENT:     Head: Normocephalic and atraumatic.  Eyes:     Conjunctiva/sclera: Conjunctivae normal.     Pupils: Pupils are equal, round, and reactive to light.  Cardiovascular:     Rate and Rhythm: Normal rate and regular rhythm.     Heart sounds: Normal heart sounds.  Pulmonary:     Effort: Pulmonary effort is normal. No respiratory distress.     Breath sounds: Normal breath sounds.  Abdominal:     General: There is no distension.     Palpations: Abdomen is soft.     Tenderness: There is no abdominal tenderness.  Genitourinary:    Comments: CVA tenderness present on left Musculoskeletal:        General: No deformity. Normal range of motion.     Cervical back: Normal range of motion and neck supple.  Skin:    General: Skin is warm and dry.  Neurological:     General: No focal  deficit present.     Mental Status: She is alert and oriented to person, place, and time. Mental status is at baseline.     ED Results / Procedures / Treatments   Labs (all labs ordered are listed, but only abnormal results are displayed) Labs Reviewed  CBC WITH DIFFERENTIAL/PLATELET - Abnormal; Notable for the following components:      Result Value   RBC 3.69 (*)    All other components within normal limits  BASIC METABOLIC PANEL - Abnormal; Notable for the following components:   Creatinine, Ser 1.61 (*)    GFR, Estimated 42 (*)    All other components within normal limits  URINALYSIS, ROUTINE W REFLEX MICROSCOPIC - Abnormal; Notable for the following components:    APPearance HAZY (*)    Hgb urine dipstick LARGE (*)    Ketones, ur 5 (*)    Protein, ur 30 (*)    RBC / HPF >50 (*)    Bacteria, UA RARE (*)    All other components within normal limits    EKG None  Radiology CT Renal Stone Study  Result Date: 04/08/2022 CLINICAL DATA:  Flank pain.  Concern for kidney stone. EXAM: CT ABDOMEN AND PELVIS WITHOUT CONTRAST TECHNIQUE: Multidetector CT imaging of the abdomen and pelvis was performed following the standard protocol without IV contrast. RADIATION DOSE REDUCTION: This exam was performed according to the departmental dose-optimization program which includes automated exposure control, adjustment of the mA and/or kV according to patient size and/or use of iterative reconstruction technique. COMPARISON:  None Available. FINDINGS: Evaluation of this exam is limited in the absence of intravenous contrast. Lower chest: There is lung bases are clear. No intra-abdominal free air or free fluid. Hepatobiliary: A 1 cm cystic structure within the liver anterior to the gallbladder. The liver is otherwise unremarkable. No biliary dilatation. Small gallstone. No pericholecystic fluid or evidence of acute cholecystitis by CT. Pancreas: Unremarkable. No pancreatic ductal dilatation or surrounding inflammatory changes. Spleen: Normal in size without focal abnormality. Adrenals/Urinary Tract: The adrenal glands are unremarkable. There is a 6 mm stone in the proximal left ureter. There is mild fullness of the left renal collecting consistent with mild diffuse edema in the renal parenchyma. Correlation with urinalysis recommended to exclude superimposed UTI. Several additional scattered nonobstructing bilateral renal calculi measure up to 2 mm. There is no hydronephrosis on the right. The urinary bladder is unremarkable. Stomach/Bowel: There is moderate stool throughout the colon. There is no bowel obstruction or active inflammation. The appendix is normal. Vascular/Lymphatic:  The abdominal aorta and IVC are grossly unremarkable on this noncontrast CT. No portal venous gas. There is no adenopathy. Reproductive: The uterus is anteverted and suboptimally evaluated. No adnexal masses. Other: Mild diffuse mesenteric edema. Ventral hernia repair with mesh. Musculoskeletal: No acute or significant osseous findings. IMPRESSION: 1. A 6 mm proximal left ureteral stone with mild fullness of the left renal collecting system. Correlation with urinalysis recommended to exclude superimposed UTI. 2. Additional scattered punctate nonobstructing bilateral renal calculi. No hydronephrosis on the right. 3. Cholelithiasis. 4. No bowel obstruction. Normal appendix. Electronically Signed   By: Elgie Collard M.D.   On: 04/08/2022 23:40    Procedures Procedures    Medications Ordered in ED Medications  sodium chloride 0.9 % bolus 1,000 mL (has no administration in time range)  HYDROmorphone (DILAUDID) injection 1 mg (has no administration in time range)  sodium chloride 0.9 % bolus 1,000 mL (0 mLs Intravenous Stopped 04/10/22 1749)  morphine (PF)  4 MG/ML injection 4 mg (4 mg Intravenous Given 04/10/22 1550)  ondansetron (ZOFRAN) injection 4 mg (4 mg Intravenous Given 04/10/22 1549)  sodium chloride 0.9 % bolus 1,000 mL (1,000 mLs Intravenous New Bag/Given 04/10/22 1753)  HYDROmorphone (DILAUDID) injection 1 mg (1 mg Intravenous Given 04/10/22 1753)    ED Course/ Medical Decision Making/ A&P                           Medical Decision Making Amount and/or Complexity of Data Reviewed Labs: ordered.  Risk Prescription drug management.    Medical Screen Complete  This patient presented to the ED with complaint of left flank pain, nausea, vomiting.  This complaint involves an extensive number of treatment options. The initial differential diagnosis includes, but is not limited to, ureteral colic, metabolic abnormality, dehydration, infection  This presentation is: Acute, Self-Limited,  Previously Undiagnosed, Uncertain Prognosis, Complicated, Systemic Symptoms, and Threat to Life/Bodily Function  Patient with previously diagnosed ureteral colic. Patient is scheduled for lithotripsy on Monday.  Patient is presenting today with breakthrough pain and associated nausea and vomiting.  Patient treated with IV fluids, antiemetics, and narcotics.  Patient seen and evaluated in the ED by urology ([facility]).  Case and results of work-up discussed with urology.  Patient is agreeable with plan for discharge.  Patient understands need for close follow-up on Monday for lithotripsy.  Patient is reporting that she is significantly more comfortable at time of discharge than when she first presented today.    Additional history obtained:  External records from outside sources obtained and reviewed including prior ED visits and prior Inpatient records.    Lab Tests:  I ordered and personally interpreted labs.  The pertinent results include: CBC, BMP, UA  Cardiac Monitoring:  The patient was maintained on a cardiac monitor.  I personally viewed and interpreted the cardiac monitor which showed an underlying rhythm of: NSR   Medicines ordered:  I ordered medication including Zofran, Dilaudid, IV fluids, morphine for pain, dehydration Reevaluation of the patient after these medicines showed that the patient: improved    Consultations Obtained:  I consulted urology,  and discussed lab and imaging findings as well as pertinent plan of care.    Problem List / ED Course:  Ureteral colic   Reevaluation:  After the interventions noted above, I reevaluated the patient and found that they have: improved   Disposition:  After consideration of the diagnostic results and the patients response to treatment, I feel that the patent would benefit from close outpatient follow-up with urology.          Final Clinical Impression(s) / ED Diagnoses Final diagnoses:  Ureteral  colic    Rx / DC Orders ED Discharge Orders          Ordered    oxyCODONE-acetaminophen (PERCOCET/ROXICET) 5-325 MG tablet  Every 8 hours PRN        04/10/22 1934    ondansetron (ZOFRAN) 4 MG tablet  Every 6 hours        04/10/22 1934              Valarie Merino, MD 04/10/22 1941

## 2022-04-10 NOTE — ED Triage Notes (Signed)
Here from home for continued kidney stone sx, c/o L flank pain radiating to LLQ, also nausea, vomited yesterday, and oliguria. Denies known fever. Seen at [facility] Thursday for the same. Dx'd with kidney stone. Seen by urology yesterday, procedure scheduled for Monday. Ran out of pain med. Received Toradol yesterday with GU which helped.

## 2022-04-10 NOTE — Consult Note (Signed)
Urology Consult   Physician requesting consult: Dene Gentry, MD  Reason for consult: nephrolithiasis  History of Present Illness: [name] is a 36 y.o. female with a PMH of recently diagnosed left ureterolithiasis who presented to the ED with pain, nausea, and vomiting. Patient was seen in clinic on 04/09/22 where she was scheduled for ESWL on 04/12/22. In office she received toradol with good effect. She was written for norco, zofran ODT, and keflex and reports she left the office with better pain and nausea control.  Pain and nausea then recurred this morning. She has taken zofran with moderate effect. She is taking PO, though in admittedly small amounts. Pain recurred and is located in the left flank. She denies fevers, chills. She denies dysuria. No difficulty voiding.  Of note, patient states she was not taking her keflex because of nausea/emesis. She plans to start that tonight.  Past Medical History:  Diagnosis Date   Anemia    Blood transfusion without reported diagnosis    Conjunctival hemorrhage 04/11/2008   Qualifier: Diagnosis of  By: Hassell Done FNP, Nykedtra     Depression    PP no meds   FATIGUE 07/30/2009   Qualifier: Diagnosis of  By: Hassell Done FNP, Tori Milks     GONORRHEA 11/15/2007   Qualifier: Diagnosis of  By: Leilani Merl CMA, Tiffany     Hx of gonorrhea    Kidney stone on left side    Unspecified vitamin D deficiency 07/26/2013    Past Surgical History:  Procedure Laterality Date   CESAREAN SECTION N/A 05/10/2015   Procedure: CESAREAN SECTION;  Surgeon: Jonnie Kind, MD;  Location: [facility];  Service: Obstetrics;  Laterality: N/A;   HERNIA REPAIR     2003   TUBAL LIGATION     VAGINAL DELIVERY     x 3     Current Hospital Medications:  Home meds:  No current facility-administered medications on file prior to encounter.   Current Outpatient Medications on File Prior to Encounter  Medication Sig Dispense Refill   Multiple Vitamins-Minerals (MULTI-VITAMIN  GUMMIES PO) Take 1 tablet by mouth daily.     ondansetron (ZOFRAN-ODT) 4 MG disintegrating tablet Take 1 tablet (4 mg total) by mouth every 8 (eight) hours as needed for nausea or vomiting. 12 tablet 0   cephALEXin (KEFLEX) 500 MG capsule Take 1 capsule (500 mg total) by mouth 2 (two) times daily for 5 days. (Patient not taking: Reported on 04/10/2022) 10 capsule 0   clindamycin (CLEOCIN) 300 MG capsule Take 1 capsule (300 mg total) by mouth 3 (three) times daily. 30 capsule 0   HYDROcodone-acetaminophen (NORCO/VICODIN) 5-325 MG tablet Take 1 tablet by mouth every 4 (four) hours as needed. (Patient not taking: Reported on 04/10/2022) 10 tablet 0     Scheduled Meds: Continuous Infusions: PRN Meds:.  Allergies:  Allergies  Allergen Reactions   Amoxicillin Anaphylaxis and Hives    Has patient had a PCN reaction causing immediate rash, facial/tongue/throat swelling, SOB or lightheadedness with hypotension: Yes Has patient had a PCN reaction causing severe rash involving mucus membranes or skin necrosis: No Has patient had a PCN reaction that required hospitalization No Has patient had a PCN reaction occurring within the last 10 years: Yes If all of the above answers are "NO", then may proceed with Cephalosporin use.    Family History  Problem Relation Age of Onset   Hypertension Father    Hypertension Maternal Grandmother    Cancer Neg Hx    Diabetes  Neg Hx    Heart disease Neg Hx     Social History:  reports that she has never smoked. She has never used smokeless tobacco. She reports that she does not drink alcohol and does not use drugs.  ROS: A complete review of systems was performed.  All systems are negative except for pertinent findings as noted.  Physical Exam:  Vital signs in last 24 hours: Temp:  [98.1 F (36.7 C)-99.9 F (37.7 C)] 98.1 F (36.7 C) (11/04 1856) Pulse Rate:  [75-91] 83 (11/04 1930) Resp:  [16-18] 16 (11/04 1930) BP: (94-119)/(61-93) 119/77 (11/04  1930) SpO2:  [98 %-100 %] 100 % (11/04 1930) Weight:  [52.2 kg] 52.2 kg (11/04 1446) Constitutional:  Alert and oriented, uncomfortable appearing but no acute distress Cardiovascular: Regular rate and rhythm, No JVD Respiratory: Normal respiratory effort, Lungs clear bilaterally GI: Abdomen is soft, nontender, nondistended, no abdominal masses GU: Voiding spontaneously Lymphatic: No lymphadenopathy Neurologic: Grossly intact, no focal deficits Psychiatric: Normal mood and affect  Laboratory Data:  Recent Labs    04/08/22 1800 04/10/22 1611  WBC 5.2 7.0  HGB 12.3 12.3  HCT 36.0 36.9  PLT 238 240    Recent Labs    04/08/22 1800 04/10/22 1611  NA 138 139  K 3.6 3.9  CL 106 107  GLUCOSE 132* 91  BUN 11 19  CALCIUM 9.6 9.1  CREATININE 0.92 1.61*     Results for orders placed or performed during the hospital encounter of 04/10/22 (from the past 24 hour(s))  CBC with Differential     Status: Abnormal   Collection Time: 04/10/22  4:11 PM  Result Value Ref Range   WBC 7.0 4.0 - 10.5 K/uL   RBC 3.69 (L) 3.87 - 5.11 MIL/uL   Hemoglobin 12.3 12.0 - 15.0 g/dL   HCT 36.9 36.0 - 46.0 %   MCV 100.0 80.0 - 100.0 fL   MCH 33.3 26.0 - 34.0 pg   MCHC 33.3 30.0 - 36.0 g/dL   RDW 12.3 11.5 - 15.5 %   Platelets 240 150 - 400 K/uL   nRBC 0.0 0.0 - 0.2 %   Neutrophils Relative % 73 %   Neutro Abs 5.1 1.7 - 7.7 K/uL   Lymphocytes Relative 17 %   Lymphs Abs 1.2 0.7 - 4.0 K/uL   Monocytes Relative 9 %   Monocytes Absolute 0.6 0.1 - 1.0 K/uL   Eosinophils Relative 1 %   Eosinophils Absolute 0.1 0.0 - 0.5 K/uL   Basophils Relative 0 %   Basophils Absolute 0.0 0.0 - 0.1 K/uL   Immature Granulocytes 0 %   Abs Immature Granulocytes 0.02 0.00 - 0.07 K/uL  Basic metabolic panel     Status: Abnormal   Collection Time: 04/10/22  4:11 PM  Result Value Ref Range   Sodium 139 135 - 145 mmol/L   Potassium 3.9 3.5 - 5.1 mmol/L   Chloride 107 98 - 111 mmol/L   CO2 25 22 - 32 mmol/L    Glucose, Bld 91 70 - 99 mg/dL   BUN 19 6 - 20 mg/dL   Creatinine, Ser 1.61 (H) 0.44 - 1.00 mg/dL   Calcium 9.1 8.9 - 10.3 mg/dL   GFR, Estimated 42 (L) >60 mL/min   Anion gap 7 5 - 15  Urinalysis, Routine w reflex microscopic Urine, Clean Catch     Status: Abnormal   Collection Time: 04/10/22  5:50 PM  Result Value Ref Range   Color, Urine YELLOW YELLOW  APPearance HAZY (A) CLEAR   Specific Gravity, Urine 1.026 1.005 - 1.030   pH 5.0 5.0 - 8.0   Glucose, UA NEGATIVE NEGATIVE mg/dL   Hgb urine dipstick LARGE (A) NEGATIVE   Bilirubin Urine NEGATIVE NEGATIVE   Ketones, ur 5 (A) NEGATIVE mg/dL   Protein, ur 30 (A) NEGATIVE mg/dL   Nitrite NEGATIVE NEGATIVE   Leukocytes,Ua NEGATIVE NEGATIVE   RBC / HPF >50 (H) 0 - 5 RBC/hpf   WBC, UA 6-10 0 - 5 WBC/hpf   Bacteria, UA RARE (A) NONE SEEN   Squamous Epithelial / LPF 21-50 0 - 5   Mucus PRESENT    Hyaline Casts, UA PRESENT    Recent Results (from the past 240 hour(s))  Urine Culture     Status: Abnormal   Collection Time: 04/08/22  8:06 PM   Specimen: Urine, Clean Catch  Result Value Ref Range Status   Specimen Description URINE, CLEAN CATCH  Final   Special Requests NONE  Final   Culture (A)  Final    >=100,000 COLONIES/mL LACTOBACILLUS SPECIES Standardized susceptibility testing for this organism is not available. Performed at [facility], [place] [address] [place], [place] [address]    Report Status 04/09/2022 FINAL  Final    Renal Function: Recent Labs    04/08/22 1800 04/10/22 1611  CREATININE 0.92 1.61*   Estimated Creatinine Clearance: 38.2 mL/min (A) (by C-G formula based on SCr of 1.61 mg/dL (H)).  Radiologic Imaging: CT Renal Stone Study  Result Date: 04/08/2022 CLINICAL DATA:  Flank pain.  Concern for kidney stone. EXAM: CT ABDOMEN AND PELVIS WITHOUT CONTRAST TECHNIQUE: Multidetector CT imaging of the abdomen and pelvis was performed following the standard protocol without IV contrast. RADIATION DOSE  REDUCTION: This exam was performed according to the departmental dose-optimization program which includes automated exposure control, adjustment of the mA and/or kV according to patient size and/or use of iterative reconstruction technique. COMPARISON:  None Available. FINDINGS: Evaluation of this exam is limited in the absence of intravenous contrast. Lower chest: There is lung bases are clear. No intra-abdominal free air or free fluid. Hepatobiliary: A 1 cm cystic structure within the liver anterior to the gallbladder. The liver is otherwise unremarkable. No biliary dilatation. Small gallstone. No pericholecystic fluid or evidence of acute cholecystitis by CT. Pancreas: Unremarkable. No pancreatic ductal dilatation or surrounding inflammatory changes. Spleen: Normal in size without focal abnormality. Adrenals/Urinary Tract: The adrenal glands are unremarkable. There is a 6 mm stone in the proximal left ureter. There is mild fullness of the left renal collecting consistent with mild diffuse edema in the renal parenchyma. Correlation with urinalysis recommended to exclude superimposed UTI. Several additional scattered nonobstructing bilateral renal calculi measure up to 2 mm. There is no hydronephrosis on the right. The urinary bladder is unremarkable. Stomach/Bowel: There is moderate stool throughout the colon. There is no bowel obstruction or active inflammation. The appendix is normal. Vascular/Lymphatic: The abdominal aorta and IVC are grossly unremarkable on this noncontrast CT. No portal venous gas. There is no adenopathy. Reproductive: The uterus is anteverted and suboptimally evaluated. No adnexal masses. Other: Mild diffuse mesenteric edema. Ventral hernia repair with mesh. Musculoskeletal: No acute or significant osseous findings. IMPRESSION: 1. A 6 mm proximal left ureteral stone with mild fullness of the left renal collecting system. Correlation with urinalysis recommended to exclude superimposed UTI. 2.  Additional scattered punctate nonobstructing bilateral renal calculi. No hydronephrosis on the right. 3. Cholelithiasis. 4. No bowel obstruction. Normal appendix. Electronically  Signed   By: Anner Crete M.D.   On: 04/08/2022 23:40    I independently reviewed the above imaging studies.  Impression/Recommendation: 9mm proximal left ureteral stone  - Seen in clinic 04/09/22, pending ESWL 04/12/22 - Pain and nausea poorly controlled, patient worried as she is almost out of prescriptions - Afebrile, HDS. Labs notable for AKI but otherwise relatively unremarkable - Ucx 04/08/22 +Lactobacillus, likely contaminant given + squams on UA. Repeat UA today also contaminated but not concerning - Discussed options including continued expectant management and proceeding as scheduled with ESWL versus ureteral stent placement this admission. Discussed that stent would not preclude ESWL. Nonetheless, patient hesitant to undergo stent placement and prefers a refill of pain rx, antiemetics, flomax, and discharge home. No toradol. - Discussed return precautions including fevers, refractory nausea/vomiting, intractable pain  Lamar Laundry 04/10/2022, 8:10 PM

## 2022-04-10 NOTE — Discharge Instructions (Signed)
Return for any problem.  ?

## 2022-04-10 NOTE — Telephone Encounter (Signed)
Post ED Visit - Positive Culture Follow-up  Culture report reviewed by antimicrobial stewardship pharmacist: [facility] Team []  Elenor Quinones, Pharm.D. []  Heide Guile, Pharm.D., BCPS AQ-ID []  Parks Neptune, Pharm.D., BCPS []  Alycia Rossetti, Pharm.D., BCPS []  Andrews, Pharm.D., BCPS, AAHIVP []  Legrand Como, Pharm.D., BCPS, AAHIVP []  Salome Arnt, PharmD, BCPS []  Johnnette Gourd, PharmD, BCPS []  Hughes Better, PharmD, BCPS []  Leeroy Cha, PharmD []  Laqueta Linden, PharmD, BCPS [x]  Franchot Gallo, PharmD  [facility] Team []  Leodis Sias, PharmD []  Lindell Spar, PharmD []  Royetta Asal, PharmD []  Graylin Shiver, Rph []  Rema Fendt) Glennon Mac, PharmD []  Arlyn Dunning, PharmD []  Netta Cedars, PharmD []  Dia Sitter, PharmD []  Leone Haven, PharmD []  Gretta Arab, PharmD []  Theodis Shove, PharmD []  Peggyann Juba, PharmD []  Reuel Boom, PharmD   Positive urine culture Treated with Cephalexin Pt to stay on Keflex and f/u with urology  [name] 04/10/2022, 3:16 PM

## 2022-04-12 ENCOUNTER — Encounter (HOSPITAL_BASED_OUTPATIENT_CLINIC_OR_DEPARTMENT_OTHER): Payer: Self-pay | Admitting: Urology

## 2022-04-12 ENCOUNTER — Encounter (HOSPITAL_BASED_OUTPATIENT_CLINIC_OR_DEPARTMENT_OTHER)
Admission: RE | Disposition: A | Discharge status: Home / Self Care | Payer: Self-pay | Source: Home / Self Care | Attending: Urology

## 2022-04-12 ENCOUNTER — Other Ambulatory Visit: Payer: Self-pay

## 2022-04-12 ENCOUNTER — Ambulatory Visit (HOSPITAL_BASED_OUTPATIENT_CLINIC_OR_DEPARTMENT_OTHER)
Admission: RE | Admit: 2022-04-12 | Discharge: 2022-04-12 | Disposition: A | Discharge status: Home / Self Care | Payer: Medicaid Other | Attending: Urology | Admitting: Urology

## 2022-04-12 ENCOUNTER — Ambulatory Visit (HOSPITAL_COMMUNITY): Payer: Medicaid Other

## 2022-04-12 DIAGNOSIS — N201 Calculus of ureter: Secondary | ICD-10-CM | POA: Insufficient documentation

## 2022-04-12 DIAGNOSIS — Z01818 Encounter for other preprocedural examination: Secondary | ICD-10-CM

## 2022-04-12 DIAGNOSIS — K6389 Other specified diseases of intestine: Secondary | ICD-10-CM | POA: Diagnosis not present

## 2022-04-12 DIAGNOSIS — K469 Unspecified abdominal hernia without obstruction or gangrene: Secondary | ICD-10-CM | POA: Diagnosis not present

## 2022-04-12 DIAGNOSIS — I878 Other specified disorders of veins: Secondary | ICD-10-CM | POA: Diagnosis not present

## 2022-04-12 HISTORY — PX: EXTRACORPOREAL SHOCK WAVE LITHOTRIPSY: SHX1557

## 2022-04-12 LAB — POCT PREGNANCY, URINE: Preg Test, Ur: NEGATIVE

## 2022-04-12 SURGERY — LITHOTRIPSY, ESWL
Anesthesia: LOCAL | Laterality: Left

## 2022-04-12 MED ORDER — SODIUM CHLORIDE 0.9% FLUSH: 3.0000 mL | Freq: Two times a day (BID) | INTRAVENOUS | Status: DC

## 2022-04-12 MED ORDER — OXYCODONE-ACETAMINOPHEN 5-325 MG PO TABS: 1.0000 | ORAL_TABLET | Freq: Three times a day (TID) | ORAL | 0 refills | Status: DC | PRN

## 2022-04-12 MED ORDER — DIAZEPAM 5 MG PO TABS
ORAL_TABLET | ORAL | Status: AC
  Filled 2022-04-12: qty 2

## 2022-04-12 MED ORDER — TAMSULOSIN HCL 0.4 MG PO CAPS: 0.4000 mg | ORAL_CAPSULE | Freq: Every day | ORAL | 1 refills | Status: DC

## 2022-04-12 MED ORDER — CIPROFLOXACIN HCL 500 MG PO TABS
ORAL_TABLET | ORAL | Status: AC
  Filled 2022-04-12: qty 1

## 2022-04-12 MED ORDER — DIPHENHYDRAMINE HCL 25 MG PO CAPS
ORAL_CAPSULE | ORAL | Status: AC
  Filled 2022-04-12: qty 1

## 2022-04-12 MED ORDER — DIPHENHYDRAMINE HCL 25 MG PO CAPS
25.0000 mg | ORAL_CAPSULE | ORAL | Status: AC
  Administered 2022-04-12: 25 mg via ORAL

## 2022-04-12 MED ORDER — CIPROFLOXACIN HCL 500 MG PO TABS
500.0000 mg | ORAL_TABLET | ORAL | Status: AC
  Administered 2022-04-12: 500 mg via ORAL

## 2022-04-12 MED ORDER — SODIUM CHLORIDE 0.9 % IV SOLN: INTRAVENOUS | Status: DC

## 2022-04-12 MED ORDER — DIAZEPAM 5 MG PO TABS
10.0000 mg | ORAL_TABLET | ORAL | Status: AC
  Administered 2022-04-12: 10 mg via ORAL

## 2022-04-12 NOTE — H&P (Signed)
36 year old female presented to the emergency department yesterday with acute onset left flank pain. She ultimately was diagnosed with a left proximal 5 mm ureteral stone. She was treated with oral narcotics, given 2 Percocet over the span of a few hours. This seemed to alleviate her pain. She was subsequently discharged. She woke up this morning at 4 AM with severe pain and took another Percocet that did seem to treat her pain. She is visibly uncomfortable today. She was given an antibiotic in the emergency room, but has been unable to take it because of her nausea and vomiting. She does not have any fevers or chills. Today's urine analysis is of low suspicion. She denies any dysuria.   The patient has no history of kidney stones.     ALLERGIES: Amoxicillin - Hives Ibuprofen    MEDICATIONS: Multivitamin     GU PSH: None   NON-GU PSH: Hernia Repair, x2 (2003, 2006)     GU PMH: None   NON-GU PMH: Anxiety Depression    FAMILY HISTORY: 2 daughters - Daughter 3 Son's - Son Hypertension - Father nephrolithiasis - Grandmother   SOCIAL HISTORY: Marital Status: Single Preferred Language: English; Ethnicity: Not Hispanic Or Latino; Race: Black or African American Current Smoking Status: Patient has never smoked.   Tobacco Use Assessment Completed: Used Tobacco in last 30 days? Does drink.  Does not drink caffeine. Patient's occupation Advertising copywriter.     Notes: Caffeinated drinks: Rarely   REVIEW OF SYSTEMS:    GU Review Female:   Patient reports get up at night to urinate and have to strain to urinate. Patient denies frequent urination, hard to postpone urination, burning /pain with urination, leakage of urine, stream starts and stops, trouble starting your stream, and being pregnant.  Gastrointestinal (Upper):   Patient reports nausea and vomiting. Patient denies indigestion/ heartburn.  Gastrointestinal (Lower):   Patient denies diarrhea and constipation.   Constitutional:   Patient denies fever, night sweats, weight loss, and fatigue.  Skin:   Patient denies skin rash/ lesion and itching.  Eyes:   Patient denies blurred vision and double vision.  Ears/ Nose/ Throat:   Patient denies sore throat and sinus problems.  Hematologic/Lymphatic:   Patient denies swollen glands and easy bruising.  Cardiovascular:   Patient denies leg swelling and chest pains.  Respiratory:   Patient denies cough and shortness of breath.  Endocrine:   Patient denies excessive thirst.  Musculoskeletal:   Patient reports back pain. Patient denies joint pain.  Neurological:   Patient denies headaches and dizziness.  Psychologic:   Patient denies depression and anxiety.   Notes: Weak stream    VITAL SIGNS:      04/09/2022 10:40 AM  Weight 112 lb / 50.8 kg  BP 113/78 mmHg  Pulse 76 /min  Temperature 97.8 F / 36.5 C   MULTI-SYSTEM PHYSICAL EXAMINATION:    Constitutional: Well-nourished. No physical deformities. Normally developed. Good grooming.  Neck: Neck symmetrical, not swollen. Normal tracheal position.  Respiratory: Normal breath sounds. No labored breathing, no use of accessory muscles.   Cardiovascular: Regular rate and rhythm. No murmur, no gallop. Normal temperature, normal extremity pulses, no swelling, no varicosities.   Lymphatic: No enlargement of neck, axillae, groin.  Skin: No paleness, no jaundice, no cyanosis. No lesion, no ulcer, no rash.  Neurologic / Psychiatric: Oriented to time, oriented to place, oriented to person. No depression, no anxiety, no agitation.  Gastrointestinal: No mass, no tenderness, no rigidity, non obese abdomen.  Eyes: Normal conjunctivae. Normal eyelids.  Ears, Nose, Mouth, and Throat: Left ear no scars, no lesions, no masses. Right ear no scars, no lesions, no masses. Nose no scars, no lesions, no masses. Normal hearing. Normal lips.  Musculoskeletal: Normal gait and station of head and neck.     Complexity of Data:   Source Of History:  Patient  Records Review:   Previous Doctor Records, Previous Patient Records, POC Tool  Urine Test Review:   Urinalysis  X-Ray Review: KUB: Reviewed Films. Discussed With Patient.  C.T. Abdomen/Pelvis: Reviewed Films. Discussed With Patient.     PROCEDURES:         KUB - [id number]  A single view of the abdomen is obtained. Renal shadows are easily visualized bilaterally. There are no stones appreciated within the expected location in either renal pelvis. There are no additional calcifications along the expected location of either ureter bilaterally.  Gas pattern is grossly normal. No significant bony abnormalities.      Patient confirmed No Neulasta OnPro Device.           Urinalysis w/Scope - 81001 Dipstick Dipstick Cont'd Micro  Color: Yellow Bilirubin: Neg WBC/hpf: 0 - 5/hpf  Appearance: Slightly Cloudy Ketones: 1+ RBC/hpf: 3 - 10/hpf  Specific Gravity: 1.030 Blood: 2+ Bacteria: Rare (0-9/hpf)  pH: 6.0 Protein: Trace Cystals: NS (Not Seen)  Glucose: Neg Urobilinogen: 1.0 Casts: NS (Not Seen)    Nitrites: Neg Trichomonas: Not Present    Leukocyte Esterase: Neg Mucous: Not Present      Epithelial Cells: 10 - 20/hpf      Yeast: NS (Not Seen)      Sperm: Not Present    Notes:            Ceftriaxone 1g - V5643, [id number] Qty: 1 Adm. By: Cristobal Goldmann  Unit: gram Lot No [id number]  Route: IM Exp. Date 03/07/2024  Freq: None Mfgr.:   Site: Right Buttock         Ketoralac 60mg  - [id number], [id number] Qty: 60 Adm. By: Cristobal Goldmann  Unit: mg Lot No [id number]  Route: IM Exp. Date 08/06/2022  Freq: None Mfgr.:   Site: Left Buttock   ASSESSMENT:      ICD-10 Details  1 GU:   Ureteral calculus - N20.1    PLAN:           Orders X-Rays: KUB  X-Ray Notes: Patient had UPT last night as well as CT Scan.          Schedule         Document Letter(s):  Created for Patient: Clinical Summary         Notes:   The patient's stone is difficult to visualize at the left  UPJ/proximal ureter. I do think that it is visible, especially when able to correlate with the CT scan. We discussed management strategies, the patient is opted to proceed with shockwave lithotripsy. This is going to be scheduled with Dr. Jeffie Pollock. I will run it by him to ensure that he is comfortable shocking the stone. The patient actually may need some contrast at the time of the procedure to find a stone.   I went through the shockwave lithotripsy procedure with her in detail. She understands the potential of partial fragmentation and residual stone fragments causing more pain and potentially requiring an additional procedure. We discussed the potential for hematuria. We discussed potential for hematoma. We discussed alternatives. She is opted to proceed with the shockwave lithotripsy.  Next Appointment:      Next Appointment: 04/12/2022 10:00 AM    Appointment Type: Surgery     Location: [facility] [phone]    Provider: Bjorn Pippin, M.D.    Reason for Visit: NE/OP (L) ESWL

## 2022-04-12 NOTE — Interval H&P Note (Signed)
History and Physical Interval Note: no change in stone  04/12/2022 9:06 AM  [name]  has presented today for surgery, with the diagnosis of LEFT PROXIMAL URETERAL STONE.  The various methods of treatment have been discussed with the patient and family. After consideration of risks, benefits and other options for treatment, the patient has consented to  Procedure(s) with comments: LEFT EXTRACORPOREAL SHOCK WAVE LITHOTRIPSY (ESWL) (Left) - 75 MINUTES NEEDED FOR CASE as a surgical intervention.  The patient's history has been reviewed, patient examined, no change in status, stable for surgery.  I have reviewed the patient's chart and labs.  Questions were answered to the patient's satisfaction.     [name]

## 2022-04-12 NOTE — Op Note (Signed)
See [facility] Op note.

## 2022-04-13 ENCOUNTER — Encounter (HOSPITAL_BASED_OUTPATIENT_CLINIC_OR_DEPARTMENT_OTHER): Payer: Self-pay | Admitting: Urology

## 2022-04-26 DIAGNOSIS — N201 Calculus of ureter: Secondary | ICD-10-CM | POA: Diagnosis not present

## 2022-07-27 DIAGNOSIS — R8271 Bacteriuria: Secondary | ICD-10-CM | POA: Diagnosis not present

## 2022-07-27 DIAGNOSIS — N200 Calculus of kidney: Secondary | ICD-10-CM | POA: Diagnosis not present

## 2023-11-09 ENCOUNTER — Ambulatory Visit (HOSPITAL_COMMUNITY)
Admission: EM | Admit: 2023-11-09 | Discharge: 2023-11-09 | Disposition: A | Discharge status: Home / Self Care | Attending: Sports Medicine | Admitting: Sports Medicine

## 2023-11-09 ENCOUNTER — Encounter (HOSPITAL_COMMUNITY): Payer: Self-pay | Admitting: Emergency Medicine

## 2023-11-09 DIAGNOSIS — S161XXA Strain of muscle, fascia and tendon at neck level, initial encounter: Secondary | ICD-10-CM

## 2023-11-09 DIAGNOSIS — M545 Low back pain, unspecified: Secondary | ICD-10-CM

## 2023-11-09 MED ORDER — NAPROXEN 500 MG PO TABS: 500.0000 mg | ORAL_TABLET | Freq: Two times a day (BID) | ORAL | 0 refills | Status: AC

## 2023-11-09 MED ORDER — METHOCARBAMOL 500 MG PO TABS: 500.0000 mg | ORAL_TABLET | Freq: Two times a day (BID) | ORAL | 0 refills | Status: AC

## 2023-11-09 MED ORDER — KETOROLAC TROMETHAMINE 60 MG/2ML IM SOLN
INTRAMUSCULAR | Status: AC
  Filled 2023-11-09: qty 2

## 2023-11-09 MED ORDER — KETOROLAC TROMETHAMINE 30 MG/ML IJ SOLN
60.0000 mg | Freq: Once | INTRAMUSCULAR | Status: AC
  Administered 2023-11-09: 60 mg via INTRAMUSCULAR
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
